# Patient Record
Sex: Female | Born: 1980 | Race: Black or African American | Hispanic: No | Marital: Single | Smoking: Former smoker
Health system: Southern US, Community
[De-identification: ages and names within clinical notes are randomized; demographics above are authoritative.]

## PROBLEM LIST (undated history)

## (undated) DIAGNOSIS — I509 Heart failure, unspecified: Secondary | ICD-10-CM

## (undated) DIAGNOSIS — F32A Depression, unspecified: Secondary | ICD-10-CM

## (undated) DIAGNOSIS — F319 Bipolar disorder, unspecified: Secondary | ICD-10-CM

## (undated) DIAGNOSIS — E785 Hyperlipidemia, unspecified: Secondary | ICD-10-CM

## (undated) DIAGNOSIS — G4733 Obstructive sleep apnea (adult) (pediatric): Secondary | ICD-10-CM

## (undated) DIAGNOSIS — F419 Anxiety disorder, unspecified: Secondary | ICD-10-CM

## (undated) HISTORY — DX: Bipolar disorder, unspecified: F31.9

## (undated) HISTORY — DX: Hyperlipidemia, unspecified: E78.5

## (undated) HISTORY — PX: SHOULDER SURGERY: SHX246

## (undated) HISTORY — DX: Heart failure, unspecified: I50.9

## (undated) HISTORY — DX: Depression, unspecified: F32.A

## (undated) HISTORY — DX: Anxiety disorder, unspecified: F41.9

## (undated) HISTORY — DX: Obstructive sleep apnea (adult) (pediatric): G47.33

---

## 2004-07-07 ENCOUNTER — Observation Stay: Payer: Self-pay | Admitting: Obstetrics & Gynecology

## 2004-08-24 ENCOUNTER — Observation Stay: Payer: Self-pay

## 2004-11-30 ENCOUNTER — Emergency Department: Payer: Self-pay | Admitting: Emergency Medicine

## 2006-01-16 ENCOUNTER — Emergency Department: Payer: Self-pay | Admitting: Emergency Medicine

## 2006-04-18 ENCOUNTER — Observation Stay: Payer: Self-pay | Admitting: Obstetrics and Gynecology

## 2006-06-21 ENCOUNTER — Observation Stay: Payer: Self-pay

## 2006-08-09 ENCOUNTER — Inpatient Hospital Stay: Payer: Self-pay | Admitting: Obstetrics and Gynecology

## 2008-02-18 ENCOUNTER — Emergency Department: Payer: Self-pay | Admitting: Emergency Medicine

## 2010-03-16 ENCOUNTER — Emergency Department: Payer: Self-pay | Admitting: Emergency Medicine

## 2011-03-24 ENCOUNTER — Emergency Department: Payer: Self-pay | Admitting: Unknown Physician Specialty

## 2011-04-23 ENCOUNTER — Encounter: Payer: Self-pay | Admitting: Physician Assistant

## 2011-06-05 ENCOUNTER — Ambulatory Visit: Payer: Self-pay | Admitting: Physician Assistant

## 2011-06-05 LAB — HCG, QUANTITATIVE, PREGNANCY: Beta Hcg, Quant.: <1 m[IU]/mL — ABNORMAL LOW

## 2011-06-26 ENCOUNTER — Ambulatory Visit: Payer: Self-pay | Admitting: Unknown Physician Specialty

## 2012-01-07 ENCOUNTER — Encounter: Payer: Self-pay | Admitting: Orthopedic Surgery

## 2012-01-08 ENCOUNTER — Encounter: Payer: Self-pay | Admitting: Orthopedic Surgery

## 2012-02-08 ENCOUNTER — Encounter: Payer: Self-pay | Admitting: Orthopedic Surgery

## 2013-01-03 ENCOUNTER — Emergency Department: Payer: Self-pay | Admitting: Emergency Medicine

## 2014-08-31 ENCOUNTER — Emergency Department
Admission: EM | Admit: 2014-08-31 | Discharge: 2014-08-31 | Disposition: A | Discharge status: Home / Self Care | Payer: Medicare Other | Attending: Emergency Medicine | Admitting: Emergency Medicine

## 2014-08-31 DIAGNOSIS — R0982 Postnasal drip: Secondary | ICD-10-CM | POA: Insufficient documentation

## 2014-08-31 DIAGNOSIS — R42 Dizziness and giddiness: Secondary | ICD-10-CM | POA: Diagnosis not present

## 2014-08-31 DIAGNOSIS — H6503 Acute serous otitis media, bilateral: Secondary | ICD-10-CM | POA: Diagnosis not present

## 2014-08-31 DIAGNOSIS — H9203 Otalgia, bilateral: Secondary | ICD-10-CM | POA: Diagnosis present

## 2014-08-31 DIAGNOSIS — Z720 Tobacco use: Secondary | ICD-10-CM | POA: Diagnosis not present

## 2014-08-31 DIAGNOSIS — J3489 Other specified disorders of nose and nasal sinuses: Secondary | ICD-10-CM | POA: Insufficient documentation

## 2014-08-31 MED ORDER — MECLIZINE HCL 12.5 MG PO TABS: 12.5000 mg | ORAL_TABLET | Freq: Three times a day (TID) | ORAL | Status: AC | PRN

## 2014-08-31 MED ORDER — OXYMETAZOLINE HCL 0.05 % NA SOLN: 1.0000 | Freq: Two times a day (BID) | NASAL | Status: AC

## 2014-08-31 MED ORDER — IBUPROFEN 800 MG PO TABS: 800.0000 mg | ORAL_TABLET | Freq: Three times a day (TID) | ORAL | Status: DC | PRN

## 2014-08-31 MED ORDER — LORATADINE-PSEUDOEPHEDRINE ER 5-120 MG PO TB12: 1.0000 | ORAL_TABLET | Freq: Two times a day (BID) | ORAL | Status: AC

## 2014-08-31 NOTE — ED Notes (Signed)
Patient c/o bilateral earache x1 week. States right worse than left.

## 2014-08-31 NOTE — ED Provider Notes (Signed)
CSN: [id number]     Arrival date & time 08/31/14  1230 History   First MD Initiated Contact with Patient 08/31/14 1259     Chief Complaint  Patient presents with  . Otalgia     (Consider location/radiation/quality/duration/timing/severity/associated sxs/prior Treatment) HPI The patient last 2 weeks complaining of fullness behind her right ear now into her left ear sinus congestion states that it's an achy feeling rates it as about a 4 out of 10 at the worst nothing seemingly making it better or worse states that when she moves her head too quickly certain when she does get a little dizzy one point she felt like she might pass out last week otherwise denies fevers chills nausea trauma or any other complaints of note History reviewed. No pertinent past medical history. Past Surgical History  Procedure Laterality Date  . Cesarean section    . Shoulder surgery Right    History reviewed. No pertinent family history. History  Substance Use Topics  . Smoking status: Current Every Day Smoker  . Smokeless tobacco: Not on file  . Alcohol Use: No   OB History    No data available     Review of Systems  Constitutional: Negative.   HENT: Positive for congestion, postnasal drip, rhinorrhea and sinus pressure.   Eyes: Negative.   Respiratory: Negative.   Cardiovascular: Negative.   Gastrointestinal: Negative.   Genitourinary: Negative.   Musculoskeletal: Negative.   Skin: Negative.   Allergic/Immunologic: Positive for environmental allergies.  Neurological: Positive for dizziness.  All other systems reviewed and are negative.      Allergies  Review of patient's allergies indicates no known allergies.  Home Medications   Prior to Admission medications   Medication Sig Start Date End Date Taking? Authorizing Provider  ibuprofen (ADVIL,MOTRIN) 800 MG tablet Take 1 tablet (800 mg total) by mouth every 8 (eight) hours as needed. 08/31/14   III William C Ruffian, PA-C   loratadine-pseudoephedrine (CLARITIN-D 12 HOUR) 5-120 MG per tablet Take 1 tablet by mouth 2 (two) times daily. 08/31/14 08/31/15  III William C Ruffian, PA-C  meclizine (ANTIVERT) 12.5 MG tablet Take 1 tablet (12.5 mg total) by mouth 3 (three) times daily as needed for dizziness or nausea. 08/31/14 08/31/15  III William C Ruffian, PA-C  oxymetazoline (AFRIN) 0.05 % nasal spray Place 1 spray into both nostrils 2 (two) times daily. 08/31/14 09/03/14  III William C Ruffian, PA-C   BP 130/87 mmHg  Pulse 64  Temp(Src) 98.4 F (36.9 C) (Oral)  Resp 15  Ht  (1.549 m)  Wt 210 lb (95.255 kg)  BMI 39.70 kg/m2  SpO2 100%  LMP 08/01/2014 (Approximate) Physical Exam  Female appearing stated age well-developed well-nourished and in no acute distress Vitals reviewed Head ears eyes nose neck and throat exam reveals pupils that are equal round reactive to light and accommodation extraocular motions are intact maxillary sinus tenderness fluid behind both eardrums postnasal drainage and rhinorrhea Cardiovascular regular rate and rhythm no murmurs rubs gallops Pulmonary lungs clear to auscultation bilaterally Skin is free of rash or disease Neuro exam is nonfocal cerebellar functions are intact cranial nerves II through XII grossly intact     Procedures   MDM  The patient has had sinus congestion and nasal drainage and pressure behind her ears without fever or other symptoms of note states that with quick movements she does get dizzy and does feel like there is fluid behind her ears exam confirms that she does appear to  have a serous otitis media start the patient on decongestants and have her follow-up with ear nose and throat return here for any acute concerns or worsening symptoms Final diagnoses:  Bilateral acute serous otitis media, recurrence not specified       III Rosalyn GessWilliam C Ruffian, PA-C 08/31/14 1408  Emily FilbertJonathan E Williams, MD 08/31/14 [phone]

## 2014-08-31 NOTE — ED Notes (Signed)
Pt c/o right earache X 2 weeks, dizziness, syncope X 1 last week. Pt alert and oriented X4, active, cooperative, pt in NAD. RR even and unlabored, color WNL.

## 2014-09-20 DIAGNOSIS — Z792 Long term (current) use of antibiotics: Secondary | ICD-10-CM | POA: Insufficient documentation

## 2014-09-20 DIAGNOSIS — Z720 Tobacco use: Secondary | ICD-10-CM | POA: Insufficient documentation

## 2014-09-20 DIAGNOSIS — J40 Bronchitis, not specified as acute or chronic: Secondary | ICD-10-CM | POA: Insufficient documentation

## 2014-09-21 ENCOUNTER — Emergency Department
Admission: EM | Admit: 2014-09-21 | Discharge: 2014-09-21 | Disposition: A | Discharge status: Home / Self Care | Payer: Medicare Other | Attending: Emergency Medicine | Admitting: Emergency Medicine

## 2014-09-21 ENCOUNTER — Emergency Department: Payer: Medicare Other

## 2014-09-21 ENCOUNTER — Encounter: Payer: Self-pay | Admitting: *Deleted

## 2014-09-21 DIAGNOSIS — J40 Bronchitis, not specified as acute or chronic: Secondary | ICD-10-CM

## 2014-09-21 MED ORDER — ALBUTEROL SULFATE (2.5 MG/3ML) 0.083% IN NEBU
INHALATION_SOLUTION | RESPIRATORY_TRACT | Status: AC
  Administered 2014-09-21: 2.5 mg via RESPIRATORY_TRACT
  Filled 2014-09-21: qty 3

## 2014-09-21 MED ORDER — KETOROLAC TROMETHAMINE 60 MG/2ML IM SOLN: 60.0000 mg | Freq: Once | INTRAMUSCULAR | Status: DC

## 2014-09-21 MED ORDER — ALBUTEROL SULFATE HFA 108 (90 BASE) MCG/ACT IN AERS: 2.0000 | INHALATION_SPRAY | Freq: Four times a day (QID) | RESPIRATORY_TRACT | Status: DC | PRN

## 2014-09-21 MED ORDER — BENZONATATE 100 MG PO CAPS: 100.0000 mg | ORAL_CAPSULE | Freq: Four times a day (QID) | ORAL | Status: AC | PRN

## 2014-09-21 MED ORDER — BENZONATATE 100 MG PO CAPS
100.0000 mg | ORAL_CAPSULE | Freq: Once | ORAL | Status: AC
  Administered 2014-09-21: 100 mg via ORAL

## 2014-09-21 MED ORDER — ALBUTEROL SULFATE (2.5 MG/3ML) 0.083% IN NEBU
2.5000 mg | INHALATION_SOLUTION | Freq: Once | RESPIRATORY_TRACT | Status: AC
  Administered 2014-09-21: 2.5 mg via RESPIRATORY_TRACT

## 2014-09-21 MED ORDER — BENZONATATE 100 MG PO CAPS
ORAL_CAPSULE | ORAL | Status: AC
  Administered 2014-09-21: 100 mg via ORAL
  Filled 2014-09-21: qty 1

## 2014-09-21 NOTE — ED Notes (Signed)
Pt states she has a cough for 1 day.  Pt spitting up clear phelgm.  cig smoker.  No fever

## 2014-09-21 NOTE — Discharge Instructions (Signed)
Upper Respiratory Infection, Adult An upper respiratory infection (URI) is also sometimes known as the common cold. The upper respiratory tract includes the nose, sinuses, throat, trachea, and bronchi. Bronchi are the airways leading to the lungs. Most people improve within 1 week, but symptoms can last up to 2 weeks. A residual cough may last even longer.  CAUSES Many different viruses can infect the tissues lining the upper respiratory tract. The tissues become irritated and inflamed and often become very moist. Mucus production is also common. A cold is contagious. You can easily spread the virus to others by oral contact. This includes kissing, sharing a glass, coughing, or sneezing. Touching your mouth or nose and then touching a surface, which is then touched by another person, can also spread the virus. SYMPTOMS  Symptoms typically develop 1 to 3 days after you come in contact with a cold virus. Symptoms vary from person to person. They may include:  Runny nose.  Sneezing.  Nasal congestion.  Sinus irritation.  Sore throat.  Loss of voice (laryngitis).  Cough.  Fatigue.  Muscle aches.  Loss of appetite.  Headache.  Low-grade fever. DIAGNOSIS  You might diagnose your own cold based on familiar symptoms, since most people get a cold 2 to 3 times a year. Your caregiver can confirm this based on your exam. Most importantly, your caregiver can check that your symptoms are not due to another disease such as strep throat, sinusitis, pneumonia, asthma, or epiglottitis. Blood tests, throat tests, and X-rays are not necessary to diagnose a common cold, but they may sometimes be helpful in excluding other more serious diseases. Your caregiver will decide if any further tests are required. RISKS AND COMPLICATIONS  You may be at risk for a more severe case of the common cold if you smoke cigarettes, have chronic heart disease (such as heart failure) or lung disease (such as asthma), or if  you have a weakened immune system. The very young and very old are also at risk for more serious infections. Bacterial sinusitis, middle ear infections, and bacterial pneumonia can complicate the common cold. The common cold can worsen asthma and chronic obstructive pulmonary disease (COPD). Sometimes, these complications can require emergency medical care and may be life-threatening. PREVENTION  The best way to protect against getting a cold is to practice good hygiene. Avoid oral or hand contact with people with cold symptoms. Wash your hands often if contact occurs. There is no clear evidence that vitamin C, vitamin E, echinacea, or exercise reduces the chance of developing a cold. However, it is always recommended to get plenty of rest and practice good nutrition. TREATMENT  Treatment is directed at relieving symptoms. There is no cure. Antibiotics are not effective, because the infection is caused by a virus, not by bacteria. Treatment may include:  Increased fluid intake. Sports drinks offer valuable electrolytes, sugars, and fluids.  Breathing heated mist or steam (vaporizer or shower).  Eating chicken soup or other clear broths, and maintaining good nutrition.  Getting plenty of rest.  Using gargles or lozenges for comfort.  Controlling fevers with ibuprofen or acetaminophen as directed by your caregiver.  Increasing usage of your inhaler if you have asthma. Zinc gel and zinc lozenges, taken in the first 24 hours of the common cold, can shorten the duration and lessen the severity of symptoms. Pain medicines may help with fever, muscle aches, and throat pain. A variety of non-prescription medicines are available to treat congestion and runny nose. Your caregiver   can make recommendations and may suggest nasal or lung inhalers for other symptoms.  HOME CARE INSTRUCTIONS   Only take over-the-counter or prescription medicines for pain, discomfort, or fever as directed by your  caregiver.  Use a warm mist humidifier or inhale steam from a shower to increase air moisture. This may keep secretions moist and make it easier to breathe.  Drink enough water and fluids to keep your urine clear or pale yellow.  Rest as needed.  Return to work when your temperature has returned to normal or as your caregiver advises. You may need to stay home longer to avoid infecting others. You can also use a face mask and careful hand washing to prevent spread of the virus. SEEK MEDICAL CARE IF:   After the first few days, you feel you are getting worse rather than better.  You need your caregiver's advice about medicines to control symptoms.  You develop chills, worsening shortness of breath, or brown or red sputum. These may be signs of pneumonia.  You develop yellow or brown nasal discharge or pain in the face, especially when you bend forward. These may be signs of sinusitis.  You develop a fever, swollen neck glands, pain with swallowing, or white areas in the back of your throat. These may be signs of strep throat. SEEK IMMEDIATE MEDICAL CARE IF:   You have a fever.  You develop severe or persistent headache, ear pain, sinus pain, or chest pain.  You develop wheezing, a prolonged cough, cough up blood, or have a change in your usual mucus (if you have chronic lung disease).  You develop sore muscles or a stiff neck. Document Released: 09/19/2000 Document Revised: 06/18/2011 Document Reviewed: 07/01/2013 Exit[employer] Patient Information 2015 [employer]. This information is not intended to replace advice given to you by your health care provider. Make sure you discuss any questions you have with your health care provider.  

## 2014-09-21 NOTE — ED Provider Notes (Signed)
[facility] Emergency Department Provider Note  ____________________________________________  Time seen: Approximately 133 AM  I have reviewed the triage vital signs and the nursing notes.   HISTORY  Chief Complaint Cough    HPI Sabrina Mata is a 34 y.o. female who comes in with a cough today. The patient reports that her chest feels tight and it hurts when she coughs. Reports that he use cough drops and Vicks for the cough but it did not help. The patient has not taken any Tylenol or ibuprofen. She denies any sick contacts at home. The patient reports that her cough is productive of yellow phlegm. She reports that she was here 2 weeks ago and told that she had fluid in her ear. The patient was concerned because of the chest discomfort so she came in for evaluation.She reports that her pain as a 9 out of 10 in intensity   History reviewed. No pertinent past medical history.  There are no active problems to display for this patient.   Past Surgical History  Procedure Laterality Date  . Cesarean section    . Shoulder surgery Right     Current Outpatient Rx  Name  Route  Sig  Dispense  Refill  . albuterol (PROVENTIL HFA;VENTOLIN HFA) 108 (90 BASE) MCG/ACT inhaler   Inhalation   Inhale 2 puffs into the lungs every 6 (six) hours as needed for wheezing or shortness of breath.   1 Inhaler   0   . benzonatate (TESSALON PERLES) 100 MG capsule   Oral   Take 1 capsule (100 mg total) by mouth every 6 (six) hours as needed for cough.   15 capsule   0   . ibuprofen (ADVIL,MOTRIN) 800 MG tablet   Oral   Take 1 tablet (800 mg total) by mouth every 8 (eight) hours as needed.   15 tablet   0   . loratadine-pseudoephedrine (CLARITIN-D 12 HOUR) 5-120 MG per tablet   Oral   Take 1 tablet by mouth 2 (two) times daily.   20 tablet   0   . meclizine (ANTIVERT) 12.5 MG tablet   Oral   Take 1 tablet (12.5 mg total) by mouth 3 (three) times daily as  needed for dizziness or nausea.   5 tablet   0     Allergies Review of patient's allergies indicates no known allergies.  No family history on file.  Social History History  Substance Use Topics  . Smoking status: Current Every Day Smoker  . Smokeless tobacco: Not on file  . Alcohol Use: No    Review of Systems Constitutional: No fever/chills Eyes: No visual changes. ENT: sore throat. Cardiovascular: chest pain with cough Respiratory: shortness of breath. Gastrointestinal: Post tussive emesis No abdominal pain.  No nausea,    Genitourinary: Negative for dysuria. Musculoskeletal: Negative for back pain. Skin: Negative for rash. Neurological: Negative for headaches,  10-point ROS otherwise negative.  ____________________________________________   PHYSICAL EXAM:  VITAL SIGNS: ED Triage Vitals  Enc Vitals Group     BP 09/21/14 0029 127/100 mmHg     Pulse Rate 09/21/14 0029 93     Resp 09/21/14 0029 20     Temp 09/21/14 0029 98.7 F (37.1 C)     Temp Source 09/21/14 0029 Oral     SpO2 09/21/14 0029 100 %     Weight 09/21/14 0029 210 lb (95.255 kg)     Height 09/21/14 0029 5\' 1"  (1.549 m)  Head Cir --      Peak Flow --      Pain Score 09/21/14 0032 9     Pain Loc --      Pain Edu? --      Excl. in GC? --     Constitutional: Alert and oriented. Well appearing and in no acute distress. Eyes: Conjunctivae are normal. PERRL. EOMI. Head: Atraumatic. Nose: No congestion/rhinnorhea. Mouth/Throat: Mucous membranes are moist.  Oropharynx non-erythematous. Hematological/Lymphatic/Immunilogical: No cervical lymphadenopathy. Cardiovascular: Normal rate, regular rhythm. Grossly normal heart sounds.  Good peripheral circulation. Respiratory: Normal respiratory effort.  No retractions. Lungs CTAB. Gastrointestinal: Soft and nontender. No distention. No abdominal bruits. No CVA tenderness. Genitourinary: deferred Musculoskeletal: No lower extremity tenderness nor  edema.   Neurologic:  Normal speech and language. No gross focal neurologic deficits are appreciated.  No gait instability. Skin:  Skin is warm, dry and intact. No rash noted. Psychiatric: Mood and affect are normal.   ____________________________________________   LABS (all labs ordered are listed, but only abnormal results are displayed)  Labs Reviewed - No data to display ____________________________________________  EKG  none ____________________________________________  RADIOLOGY  CXR: Minimal bibasilar atelectasis noted, lungs otherwise clear ____________________________________________   PROCEDURES  Procedure(s) performed: None  Critical Care performed: No  ____________________________________________   INITIAL IMPRESSION / ASSESSMENT AND PLAN / ED COURSE  Pertinent labs & imaging results that were available during my care of the patient were reviewed by me and considered in my medical decision making (see chart for details).  This is a 34 year old female who comes in with cough and chest discomfort with cough. She also reports some chest tightness. The patient did receive a breathing treatment and had Toradol ordered but the patient did not want the shot. I give the patient a dose of benzonatate as well. She reports that after the breathing treatment and cough medicine that her symptoms did improve. I will discharge the patient home and encourage her to take some high-dose ibuprofen as well as the ladder and benzonatate and have the patient follow-up with the acute care clinic. Patient agrees with the plan as stated. ____________________________________________   FINAL CLINICAL IMPRESSION(S) / ED DIAGNOSES  Final diagnoses:  Bronchitis      Rebecka Apley, MD 09/21/14 [phone]

## 2015-07-24 ENCOUNTER — Emergency Department
Admission: EM | Admit: 2015-07-24 | Discharge: 2015-07-24 | Disposition: A | Discharge status: Home / Self Care | Payer: Medicare Other | Attending: Emergency Medicine | Admitting: Emergency Medicine

## 2015-07-24 ENCOUNTER — Encounter: Payer: Self-pay | Admitting: Emergency Medicine

## 2015-07-24 DIAGNOSIS — M545 Low back pain, unspecified: Secondary | ICD-10-CM

## 2015-07-24 DIAGNOSIS — Z791 Long term (current) use of non-steroidal anti-inflammatories (NSAID): Secondary | ICD-10-CM | POA: Diagnosis not present

## 2015-07-24 DIAGNOSIS — F17210 Nicotine dependence, cigarettes, uncomplicated: Secondary | ICD-10-CM | POA: Diagnosis not present

## 2015-07-24 DIAGNOSIS — Z7951 Long term (current) use of inhaled steroids: Secondary | ICD-10-CM | POA: Diagnosis not present

## 2015-07-24 DIAGNOSIS — M5416 Radiculopathy, lumbar region: Secondary | ICD-10-CM | POA: Diagnosis not present

## 2015-07-24 MED ORDER — MELOXICAM 15 MG PO TABS: 15.0000 mg | ORAL_TABLET | Freq: Every day | ORAL | Status: DC

## 2015-07-24 MED ORDER — CYCLOBENZAPRINE HCL 5 MG PO TABS: 5.0000 mg | ORAL_TABLET | Freq: Three times a day (TID) | ORAL | Status: DC | PRN

## 2015-07-24 MED ORDER — MELOXICAM 7.5 MG PO TABS
15.0000 mg | ORAL_TABLET | Freq: Every day | ORAL | Status: DC
  Administered 2015-07-24: 15 mg via ORAL
  Filled 2015-07-24: qty 2

## 2015-07-24 NOTE — Discharge Instructions (Signed)
Back Pain, Adult °Back pain is very common in adults. The cause of back pain is rarely dangerous and the pain often gets better over time. The cause of your back pain may not be known. Some common causes of back pain include: °· Strain of the muscles or ligaments supporting the spine. °· Wear and tear (degeneration) of the spinal disks. °· Arthritis. °· Direct injury to the back. °For many people, back pain may return. Since back pain is rarely dangerous, most people can learn to manage this condition on their own. °HOME CARE INSTRUCTIONS °Watch your back pain for any changes. The following actions may help to lessen any discomfort you are feeling: °· Remain active. It is stressful on your back to sit or stand in one place for long periods of time. Do not sit, drive, or stand in one place for more than 30 minutes at a time. Take short walks on even surfaces as soon as you are able. Try to increase the length of time you walk each day. °· Exercise regularly as directed by your health care provider. Exercise helps your back heal faster. It also helps avoid future injury by keeping your muscles strong and flexible. °· Do not stay in bed. Resting more than 1-2 days can delay your recovery. °· Pay attention to your body when you bend and lift. The most comfortable positions are those that put less stress on your recovering back. Always use proper lifting techniques, including: °· Bending your knees. °· Keeping the load close to your body. °· Avoiding twisting. °· Find a comfortable position to sleep. Use a firm mattress and lie on your side with your knees slightly bent. If you lie on your back, put a pillow under your knees. °· Avoid feeling anxious or stressed. Stress increases muscle tension and can worsen back pain. It is important to recognize when you are anxious or stressed and learn ways to manage it, such as with exercise. °· Take medicines only as directed by your health care provider. Over-the-counter  medicines to reduce pain and inflammation are often the most helpful. Your health care provider may prescribe muscle relaxant drugs. These medicines help dull your pain so you can more quickly return to your normal activities and healthy exercise. °· Apply ice to the injured area: °· Put ice in a plastic bag. °· Place a towel between your skin and the bag. °· Leave the ice on for 20 minutes, 2-3 times a day for the first 2-3 days. After that, ice and heat may be alternated to reduce pain and spasms. °· Maintain a healthy weight. Excess weight puts extra stress on your back and makes it difficult to maintain good posture. °SEEK MEDICAL CARE IF: °· You have pain that is not relieved with rest or medicine. °· You have increasing pain going down into the legs or buttocks. °· You have pain that does not improve in one week. °· You have night pain. °· You lose weight. °· You have a fever or chills. °SEEK IMMEDIATE MEDICAL CARE IF:  °· You develop new bowel or bladder control problems. °· You have unusual weakness or numbness in your arms or legs. °· You develop nausea or vomiting. °· You develop abdominal pain. °· You feel faint. °  °This information is not intended to replace advice given to you by your health care provider. Make sure you discuss any questions you have with your health care provider. °  °Document Released: 03/26/2005 Document Revised: 04/16/2014 Document Reviewed: 07/28/2013 °Elsevier Interactive Patient Education ©2016 [employer]  Lumbosacral Radiculopathy Lumbosacral radiculopathy is a condition that involves the spinal nerves and nerve roots in the low back and bottom of the spine. The condition develops when these nerves and nerve roots move out of place or become inflamed and cause symptoms. CAUSES This condition may be caused by:  Pressure from a disk that bulges out of place (herniated disk). A disk is a plate of cartilage that separates bones in the spine.  Disk degeneration.  A  narrowing of the bones of the lower back (spinal stenosis).  A tumor.  An infection.  An injury that places sudden pressure on the disks that cushion the bones of your lower spine. RISK FACTORS This condition is more likely to develop in:  Males aged 30-50 years.  Females aged 28-60 years.  People who lift improperly.  People who are overweight or live a sedentary lifestyle.  People who smoke.  People who perform repetitive activities that strain the spine. SYMPTOMS Symptoms of this condition include:  Pain that goes down from the back into the legs (sciatica). This is the most common symptom. The pain may be worse with sitting, coughing, or sneezing.  Pain and numbness in the arms and legs.  Muscle weakness.  Tingling.  Loss of bladder control or bowel control. DIAGNOSIS This condition is diagnosed with a physical exam and medical history. If the pain is lasting, you may have tests, such as:  MRI scan.  X-ray.  CT scan.  Myelogram.  Nerve conduction study. TREATMENT This condition is often treated with:  Hot packs and ice applied to affected areas.  Stretches to improve flexibility.  Exercises to strengthen back muscles.  Physical therapy.  Pain medicine.  A steroid injection in the spine. In some cases, no treatment is needed. If the condition is long-lasting (chronic), or if symptoms are severe, treatment may involve surgery or lifestyle changes, such as following a weight loss plan. HOME CARE INSTRUCTIONS Medicines  Take medicines only as directed by your health care provider.  Do not drive or operate heavy machinery while taking pain medicine. Injury Care  Apply a heat pack to the injured area as directed by your health care provider.  Apply ice to the affected area:  Put ice in a plastic bag.  Place a towel between your skin and the bag.  Leave the ice on for 20-30 minutes, every 2 hours while you are awake or as needed. Or, leave the  ice on for as long as directed by your health care provider. Other Instructions  If you were shown how to do any exercises or stretches, do them as directed by your health care provider.  If your health care provider prescribed a diet or exercise program, follow it as directed.  Keep all follow-up visits as directed by your health care provider. This is important. SEEK MEDICAL CARE IF:  Your pain does not improve over time even when taking pain medicines. SEEK IMMEDIATE MEDICAL CARE IF:  Your develop severe pain.  Your pain suddenly gets worse.  You develop increasing weakness in your legs.  You lose the ability to control your bladder or bowel.  You have difficulty walking or balancing.  You have a fever.   This information is not intended to replace advice given to you by your health care provider. Make sure you discuss any questions you have with your health care provider.   Document Released: 03/26/2005 Document Revised: 08/10/2014 Document Reviewed: 03/22/2014 Elsevier Interactive Patient Education 2016 [employer]  [employer]  Your exam shows some likely strain to the lower back with some mild nerve irritation. Take the prescription meds as directed. [facility] and Dr. Ernest PineHooten as discussed. Apply ice or moist heat to the back for comfort.

## 2015-07-24 NOTE — ED Provider Notes (Signed)
[facility] Emergency Department Provider Note ____________________________________________  Time seen: 1737  I have reviewed the triage vital signs and the nursing notes.  HISTORY  Chief Complaint  Sciatica  HPI Sabrina Mata is a 35 y.o. female presents herself to the ED for 2 days complaint of low back pain with some referral to the left side. She is a history of infrequent back pain, and notes a previous visit here probably about 2 years ago. She describes she was treated with steroids and had resolution of her symptoms. She denies any lotion any weakness, foot drop, or incontinence. She denies any recent injury, trauma, or accident. She describes the pain in the midline of the lower back is a pinched, and the pain to lateral thigh as sharp in nature. She is only dosed over-the-counter back pain by mouth without significant benefit.She describes her pain as a 10/10 in triage.  History reviewed. No pertinent past medical history.  There are no active problems to display for this patient.   Past Surgical History  Procedure Laterality Date  . Cesarean section    . Shoulder surgery Right     Current Outpatient Rx  Name  Route  Sig  Dispense  Refill  . albuterol (PROVENTIL HFA;VENTOLIN HFA) 108 (90 BASE) MCG/ACT inhaler   Inhalation   Inhale 2 puffs into the lungs every 6 (six) hours as needed for wheezing or shortness of breath.   1 Inhaler   0   . benzonatate (TESSALON PERLES) 100 MG capsule   Oral   Take 1 capsule (100 mg total) by mouth every 6 (six) hours as needed for cough.   15 capsule   0   . cyclobenzaprine (FLEXERIL) 5 MG tablet   Oral   Take 1 tablet (5 mg total) by mouth 3 (three) times daily as needed for muscle spasms.   15 tablet   0   . ibuprofen (ADVIL,MOTRIN) 800 MG tablet   Oral   Take 1 tablet (800 mg total) by mouth every 8 (eight) hours as needed.   15 tablet   0   . loratadine-pseudoephedrine (CLARITIN-D 12  HOUR) 5-120 MG per tablet   Oral   Take 1 tablet by mouth 2 (two) times daily.   20 tablet   0   . meclizine (ANTIVERT) 12.5 MG tablet   Oral   Take 1 tablet (12.5 mg total) by mouth 3 (three) times daily as needed for dizziness or nausea.   5 tablet   0   . meloxicam (MOBIC) 15 MG tablet   Oral   Take 1 tablet (15 mg total) by mouth daily.   30 tablet   0    Allergies Review of patient's allergies indicates no known allergies.  History reviewed. No pertinent family history.  Social History Social History  Substance Use Topics  . Smoking status: Current Some Day Smoker    Types: Cigarettes  . Smokeless tobacco: None  . Alcohol Use: No   Review of Systems  Constitutional: Negative for fever. Cardiovascular: Negative for chest pain. Respiratory: Negative for shortness of breath. Gastrointestinal: Negative for abdominal pain, vomiting and diarrhea. Genitourinary: Negative for dysuria. Musculoskeletal: Positive for back pain. Skin: Negative for rash. Neurological: Negative for headaches, focal weakness or numbness. ____________________________________________  PHYSICAL EXAM:  VITAL SIGNS: ED Triage Vitals  Enc Vitals Group     BP 07/24/15 1649 129/84 mmHg     Pulse Rate 07/24/15 1649 103     Resp  07/24/15 1649 18     Temp 07/24/15 1649 98.4 F (36.9 C)     Temp Source 07/24/15 1649 Oral     SpO2 07/24/15 1649 98 %     Weight 07/24/15 1649 210 lb (95.255 kg)     Height 07/24/15 1649  (1.549 m)     Head Cir --      Peak Flow --      Pain Score 07/24/15 1650 10     Pain Loc --      Pain Edu? --      Excl. in GC? --    Constitutional: Alert and oriented. Well appearing and in no distress. Head: Normocephalic and atraumatic. Hematological/Lymphatic/Immunological: No lymphedema Cardiovascular: Normal rate, regular rhythm. Normal distal pulses. Respiratory: Normal respiratory effort. No wheezes/rales/rhonchi. Gastrointestinal: Soft and nontender. No  distention. Musculoskeletal: Normal spinal on it without midline tenderness, spasm, deformity, or step-off. Pacing transition from sit to stand without difficulty. She notes a negative seated straight leg raise on exam. Normal full range of motion of the bilateral hips without deficit. Nontender with normal range of motion in all extremities.  Neurologic: Cranial nerves II through XII grossly intact. Normal LE DTRs bilaterally. Normal toe dorsiflexion and foot eversion on exam. Normal gait without ataxia. Normal speech and language. No gross focal neurologic deficits are appreciated. Skin:  Skin is warm, dry and intact. No rash noted. Psychiatric: Mood and affect are normal. Patient exhibits appropriate insight and judgment. ____________________________________________  PROCEDURES  meloxicam 15 mg PO ____________________________________________  INITIAL IMPRESSION / ASSESSMENT AND PLAN / ED COURSE  Patient with lumbar sacral strain with some left radicular symptoms. Her exam otherwise is reassuring for no significant intramuscular deficit. She'll be discharged with a prescription for meloxicam and Flexeril to dose as directed. She'll be referred to [facility] for routine medical management. She is also provided with the name for orthopedics for ongoing symptom management. ____________________________________________  FINAL CLINICAL IMPRESSION(S) / ED DIAGNOSES  Final diagnoses:  Left-sided low back pain without sciatica  Left lumbar radiculopathy      Lissa Hoard, PA-C 07/24/15 1808  Arnaldo Natal, MD 07/25/15 [phone]

## 2015-07-24 NOTE — ED Notes (Signed)
Back pain that goes down leg x 3 days

## 2015-07-24 NOTE — ED Notes (Signed)
See triage note..the patient sts she has been seen here before for leg pain.  Denies injury.  Pt sts she has been taking "[employer] back and body pain" medication.

## 2019-10-01 ENCOUNTER — Encounter: Payer: Self-pay | Admitting: Advanced Practice Midwife

## 2019-10-01 ENCOUNTER — Other Ambulatory Visit: Payer: Self-pay

## 2019-10-01 ENCOUNTER — Ambulatory Visit: Payer: Self-pay | Admitting: Advanced Practice Midwife

## 2019-10-01 VITALS — BP 129/85 | Ht 61.0 in | Wt 223.4 lb

## 2019-10-01 DIAGNOSIS — T7422XA Child sexual abuse, confirmed, initial encounter: Secondary | ICD-10-CM | POA: Insufficient documentation

## 2019-10-01 DIAGNOSIS — F319 Bipolar disorder, unspecified: Secondary | ICD-10-CM

## 2019-10-01 DIAGNOSIS — F4310 Post-traumatic stress disorder, unspecified: Secondary | ICD-10-CM

## 2019-10-01 DIAGNOSIS — Z113 Encounter for screening for infections with a predominantly sexual mode of transmission: Secondary | ICD-10-CM

## 2019-10-01 DIAGNOSIS — F419 Anxiety disorder, unspecified: Secondary | ICD-10-CM

## 2019-10-01 DIAGNOSIS — F4000 Agoraphobia, unspecified: Secondary | ICD-10-CM

## 2019-10-01 DIAGNOSIS — F17200 Nicotine dependence, unspecified, uncomplicated: Secondary | ICD-10-CM

## 2019-10-01 DIAGNOSIS — T7422XS Child sexual abuse, confirmed, sequela: Secondary | ICD-10-CM

## 2019-10-01 DIAGNOSIS — Z3201 Encounter for pregnancy test, result positive: Secondary | ICD-10-CM

## 2019-10-01 LAB — WET PREP FOR TRICH, YEAST, CLUE
Trichomonas Exam: NEGATIVE
Yeast Exam: NEGATIVE

## 2019-10-01 LAB — PREGNANCY, URINE: Preg Test, Ur: POSITIVE — AB

## 2019-10-01 MED ORDER — PRENATAL VITAMIN 27-0.8 MG PO TABS: 1.0000 | ORAL_TABLET | Freq: Every day | ORAL | 0 refills | Status: DC

## 2019-10-01 NOTE — Progress Notes (Signed)
PT positive today. Patient given pregnancy documentation and AMariana Kaufman card. Patient states she plans to start maternity care at [facility] at about 11-12 weeks. Patient wet mount reviewed with provider, no treatment needed per provider. Patient counseled to talk with partner about what he was treated for and then to call for appointment for treatment as contact. PNV given today.Burt Knack, RN

## 2019-10-01 NOTE — Progress Notes (Signed)
[place]

## 2019-10-01 NOTE — Progress Notes (Signed)
[facility] Department STI clinic/screening visit  Subjective:  Sabrina Mata is a 39 y.o. G4P2 smoker female being seen today for an STI screening visit because took her partner to ER over the weekend for scrotal edema; no dx but he was given a shot and po antibiotics. The patient reports they do not have symptoms.  Patient reports that they do not desire a pregnancy in the next year.   They reported they are not interested in discussing contraception today.  Patient's last menstrual period was 08/09/2019 (exact date).   Patient has the following medical conditions:   Patient Active Problem List   Diagnosis Date Noted  . Morbid obesity (H[facility]) 210 lbs 10/01/2019    No chief complaint on file.   HPI  Patient reports LMP 09/02/19.  Last sex 09/29/19 without condom.  Smoking 4-1 ppd.  Last MJ 08/16/19.  Last ETOH last night (2 mixed drinks) 2x/wk. PTSD dx'd 2018.  Bipolar and anxiety dx'd 2004.   Last HIV test per patient/review of record was unsure Patient reports last pap was unsure maybe 2008  See flowsheet for further details and programmatic requirements.    The following portions of the patient's history were reviewed and updated as appropriate: allergies, current medications, past medical history, past social history, past surgical history and problem list.  Objective:  There were no vitals filed for this visit.  Physical Exam Vitals and nursing note reviewed.  Constitutional:      Appearance: Normal appearance. She is obese.  HENT:     Head: Normocephalic and atraumatic.     Mouth/Throat:     Mouth: Mucous membranes are moist.     Pharynx: Oropharynx is clear. No oropharyngeal exudate or posterior oropharyngeal erythema.  Pulmonary:     Effort: Pulmonary effort is normal.  Abdominal:     General: Abdomen is flat.     Palpations: There is no mass.     Tenderness: There is no abdominal tenderness. There is no rebound.     Comments: Poor tone, soft  without tenderness, increased adipose  Genitourinary:    General: Normal vulva.     Exam position: Lithotomy position.     Pubic Area: No rash or pubic lice.      Labia:        Right: No rash or lesion.        Left: No rash or lesion.      Vagina: Vaginal discharge (grey luekorrhea, ph>4.5) present. No erythema, bleeding or lesions.     Cervix: Normal.     Uterus: Normal.      Rectum: Normal.     Comments: Difficult to assess due to increased adipose and pt's anxiety Lymphadenopathy:     Head:     Right side of head: No preauricular or posterior auricular adenopathy.     Left side of head: No preauricular or posterior auricular adenopathy.     Cervical: No cervical adenopathy.     Upper Body:     Right upper body: No supraclavicular or axillary adenopathy.     Left upper body: No supraclavicular or axillary adenopathy.     Lower Body: No right inguinal adenopathy. No left inguinal adenopathy.  Skin:    General: Skin is warm and dry.     Findings: No rash.  Neurological:     Mental Status: She is alert and oriented to person, place, and time.      Assessment and Plan:  Sabrina Mata  is a 39 y.o. female presenting to the [facility] Department for STI screening  1. Morbid obesity ([place]) 210 lbs   2. Screening examination for venereal disease Treat wet mount per standing orders Immunization nurse consult Please give [place] contact info to pt Please give condoms - WET PREP FOR [place], YEAST, CLUE - Pregnancy, urine - Syphilis Serology, [place] [facility] - HIV/HCV [place] [facility] - HBV Antigen/Antibody [place] [facility] - Chlamydia/Gonorrhea [place] [facility] - Gonococcus culture     No follow-ups on file.  No future appointments.  Herbie Saxon, CNM

## 2019-10-05 ENCOUNTER — Emergency Department: Payer: Medicare Other

## 2019-10-05 ENCOUNTER — Emergency Department
Admission: EM | Admit: 2019-10-05 | Discharge: 2019-10-05 | Disposition: A | Discharge status: Home / Self Care | Payer: Medicare Other | Attending: Emergency Medicine | Admitting: Emergency Medicine

## 2019-10-05 ENCOUNTER — Other Ambulatory Visit: Payer: Self-pay

## 2019-10-05 ENCOUNTER — Encounter: Payer: Self-pay | Admitting: Emergency Medicine

## 2019-10-05 DIAGNOSIS — Z3A01 Less than 8 weeks gestation of pregnancy: Secondary | ICD-10-CM | POA: Diagnosis not present

## 2019-10-05 DIAGNOSIS — O99331 Smoking (tobacco) complicating pregnancy, first trimester: Secondary | ICD-10-CM | POA: Insufficient documentation

## 2019-10-05 DIAGNOSIS — N939 Abnormal uterine and vaginal bleeding, unspecified: Secondary | ICD-10-CM | POA: Diagnosis present

## 2019-10-05 DIAGNOSIS — O209 Hemorrhage in early pregnancy, unspecified: Secondary | ICD-10-CM | POA: Diagnosis not present

## 2019-10-05 DIAGNOSIS — O09521 Supervision of elderly multigravida, first trimester: Secondary | ICD-10-CM | POA: Diagnosis not present

## 2019-10-05 DIAGNOSIS — R319 Hematuria, unspecified: Secondary | ICD-10-CM

## 2019-10-05 DIAGNOSIS — F17210 Nicotine dependence, cigarettes, uncomplicated: Secondary | ICD-10-CM | POA: Diagnosis not present

## 2019-10-05 DIAGNOSIS — O200 Threatened abortion: Secondary | ICD-10-CM

## 2019-10-05 LAB — URINALYSIS, COMPLETE (UACMP) WITH MICROSCOPIC
Bilirubin Urine: NEGATIVE
Glucose, UA: NEGATIVE mg/dL
Ketones, ur: NEGATIVE mg/dL
Nitrite: NEGATIVE
Protein, ur: 30 mg/dL — AB
RBC / HPF: >50 RBC/hpf — ABNORMAL HIGH (ref 0–5)
Specific Gravity, Urine: 1.025 (ref 1.005–1.030)
pH: 7 (ref 5.0–8.0)

## 2019-10-05 LAB — POCT PREGNANCY, URINE: Preg Test, Ur: POSITIVE — AB

## 2019-10-05 LAB — COMPREHENSIVE METABOLIC PANEL
ALT: 16 U/L (ref 0–44)
AST: 15 U/L (ref 15–41)
Albumin: 3.5 g/dL (ref 3.5–5.0)
Alkaline Phosphatase: 52 U/L (ref 38–126)
Anion gap: 7 (ref 5–15)
BUN: 16 mg/dL (ref 6–20)
CO2: 26 mmol/L (ref 22–32)
Calcium: 8.5 mg/dL — ABNORMAL LOW (ref 8.9–10.3)
Chloride: 105 mmol/L (ref 98–111)
Creatinine, Ser: 0.9 mg/dL (ref 0.44–1.00)
GFR calc Af Amer: >60 mL/min (ref >60)
GFR calc non Af Amer: >60 mL/min (ref >60)
Glucose, Bld: 101 mg/dL — ABNORMAL HIGH (ref 70–99)
Potassium: 3.7 mmol/L (ref 3.5–5.1)
Sodium: 138 mmol/L (ref 135–145)
Total Bilirubin: 0.6 mg/dL (ref 0.3–1.2)
Total Protein: 7.3 g/dL (ref 6.5–8.1)

## 2019-10-05 LAB — CBC
HCT: 38 % (ref 36.0–46.0)
Hemoglobin: 13.4 g/dL (ref 12.0–15.0)
MCH: 31 pg (ref 26.0–34.0)
MCHC: 35.3 g/dL (ref 30.0–36.0)
MCV: 88 fL (ref 80.0–100.0)
Platelets: 336 10*3/uL (ref 150–400)
RBC: 4.32 MIL/uL (ref 3.87–5.11)
RDW: 13.8 % (ref 11.5–15.5)
WBC: 11.4 10*3/uL — ABNORMAL HIGH (ref 4.0–10.5)
nRBC: 0 % (ref 0.0–0.2)

## 2019-10-05 LAB — HCG, QUANTITATIVE, PREGNANCY: hCG, Beta Chain, Quant, S: 2679 m[IU]/mL — ABNORMAL HIGH (ref <5)

## 2019-10-05 LAB — ABO/RH: ABO/RH(D): A POS

## 2019-10-05 MED ORDER — CEPHALEXIN 500 MG PO CAPS: 500.0000 mg | ORAL_CAPSULE | Freq: Three times a day (TID) | ORAL | 0 refills | Status: AC

## 2019-10-05 NOTE — ED Notes (Signed)
Pt reports being at home and wiping and "a little blood" on the toilet paper, LMP 5 May 21, pt reports 3 and 39 year old children at home; pt reports small amount of some cramping but otherwise no pain, denies dysuria or frequency

## 2019-10-05 NOTE — ED Triage Notes (Signed)
Patient states that she is [redacted] weeks pregnant. Patient states that when she went to the bathroom about 01:00 she had a small amount of vaginal bleeding. Patient states that she has had some slight intermittent abdominal cramping.

## 2019-10-05 NOTE — Discharge Instructions (Addendum)
Your pregnancy is very early.  We are seeing the gestational sac on ultrasound but not the baby.  This could be because the pregnancy is so early.  I need you to follow-up with Dr. Dalbert Garnet the OB/GYN doctor in the office in 2 days she will check on you and check your pregnancy hormone level make sure it is going up like it should be.  She will also do another ultrasound in about 11 days.  This will help [place] tell if pregnancy will continue or not and if there is a baby and then or not.  Please be sure to keep the appointment.  Please return here for heavier bleeding worse pain or any other problems.  He also seemed to have a very mild UTI.  In pregnancy this can get bad fast so we will give you some antibiotics.  I will give you some Keflex 1 pill 3 times a day to take care of it.
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
## 2019-10-06 LAB — HEPATITIS B SURFACE ANTIGEN: Hepatitis B Surface Ag: NONREACTIVE

## 2019-10-06 LAB — GONOCOCCUS CULTURE

## 2019-10-07 ENCOUNTER — Encounter: Payer: Self-pay | Admitting: Family Medicine

## 2019-10-08 ENCOUNTER — Encounter: Payer: Self-pay | Admitting: Family Medicine

## 2019-10-08 LAB — HM HEPATITIS C SCREENING LAB: HM Hepatitis Screen: NEGATIVE

## 2019-10-08 LAB — HM HIV SCREENING LAB: HM HIV Screening: NEGATIVE

## 2019-10-27 ENCOUNTER — Encounter: Payer: Self-pay | Admitting: Licensed Clinical Social Worker

## 2019-10-27 ENCOUNTER — Ambulatory Visit: Payer: Medicare Other | Admitting: Licensed Clinical Social Worker

## 2019-10-27 DIAGNOSIS — F3181 Bipolar II disorder: Secondary | ICD-10-CM

## 2019-10-27 DIAGNOSIS — F4310 Post-traumatic stress disorder, unspecified: Secondary | ICD-10-CM

## 2019-10-27 DIAGNOSIS — F4000 Agoraphobia, unspecified: Secondary | ICD-10-CM

## 2019-10-27 DIAGNOSIS — F411 Generalized anxiety disorder: Secondary | ICD-10-CM
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
## 2019-11-09 ENCOUNTER — Ambulatory Visit: Payer: Medicare Other | Admitting: Licensed Clinical Social Worker

## 2019-11-09 DIAGNOSIS — F411 Generalized anxiety disorder: Secondary | ICD-10-CM

## 2019-11-09 DIAGNOSIS — F3181 Bipolar II disorder: Secondary | ICD-10-CM

## 2019-11-09 DIAGNOSIS — F4310 Post-traumatic stress disorder, unspecified: Secondary | ICD-10-CM

## 2019-11-09 DIAGNOSIS — F4000 Agoraphobia, unspecified: Secondary | ICD-10-CM

## 2019-11-09 NOTE — Progress Notes (Signed)
Counselor/Therapist Progress Note  Patient ID: Sabrina Mata, MRN: [id number],    Date: 11/09/2019  Time Spent: 45 minutes    Treatment Type: Individual Therapy  Reported Symptoms: Obsessive thinking, Anhedonia, Sleep disturbance, Isolation and withdrawal, Fatigue and depressed mood   Mental Status Exam:  Appearance:   NA     Behavior:  Appropriate and Sharing  Motor:  NA  Speech/Language:   Normal Rate  Affect:  NA  Mood:  normal  Thought process:  normal  Thought content:    WNL  Sensory/Perceptual disturbances:    WNL  Orientation:  oriented to person, place, time/date, situation and day of week  Attention:  Good  Concentration:  Good  Memory:  WNL  Fund of knowledge:   Good  Insight:    Good  Judgment:   Good  Impulse Control:  Good   Risk Assessment: Danger to Self:  No Self-injurious Behavior: No Danger to Others: No Duty to Warn:no Physical Aggression / Violence:No  Access to Firearms a concern: No  Gang Involvement:No   Subjective: Patient was engaged and cooperative throughout the session using time effectively to discuss thoughts, feelings and treatment. Patient voices continued motivation for treatment and understanding of mood and anxiety issues related to loss. Patient is likely to benefit from future treatment because she remains motivated to decrease depression and anxiety and reports benefit of regular sessions in addressing these symptoms.   Interventions: Cognitive Behavioral Therapy and Grief Therapy  Established psychological safety. Checked in with patient regarding her week. Checked in with patient and reviewed previous session, including assessment and goal of treatment. Reviewed CBTs. Explored patient's goal of treatment, including current psychosocial stressors and worked collaboratively to develop CBT treatment plan. Provided support through active listening, validation of feelings, and highlighted patient's strengths. Provided support  through active listening, validation of feelings, and highlighted patient's strengths.   Diagnosis:   ICD-10-CM   1. Bipolar II disorder, most recent episode major depressive (HCC)  F31.81   2. Agoraphobia  F40.00   3. PTSD (post-traumatic stress disorder)  F43.10   4. GAD (generalized anxiety disorder)  F41.1    Plan: Patient's goal is: more positive outlook on life.  Treatment Target: Understand the relationship between thoughts, emotions, and behaviors  - Psychoeducation on CBT model   - Teach the connection between thoughts, emotions, and behaviors   Treatment Target: Increase realistic balanced thinking  - Explore patient's thoughts, beliefs, automatic thoughts, assumptions  - Identify unhelpful thinking patterns  - Identify and replace thinking that leads to depression and anxiety  - Process distress and allow for emotional release  - Cognitive reframing  - Questioning and challenging thoughts  Treatment Target: Increase activities - Behavioral activation      - Provide psychoeducation on Behavior Activation  - Values clarification  - Identify activities/goals aligning with values - Discuss barriers and problem solve   Treatment Target: Increase acceptance of loss  Objectives/treatment focus: Marland Kitchen Develop vocabulary to describe feelings of grief and loss . Identify grief and loss issues  . Identify steps toward managing grief . Gain awareness, and accept that grief and loss is causing problems . Deal with uncomfortable feelings resulting from grief or loss . Deal with feelings of guilt about his grief, "should haves," "if onlys" . Practice Mindfulness/acceptance meditation for anxiety/worry/ruminating thoughts  Future Appointments  Date Time Provider Department Center  11/23/2019  9:30 AM Kathreen Cosier, LCSW [facility] None    Interpreter used: NA  Kathreen Cosier,  LCSW

## 2019-11-23 ENCOUNTER — Ambulatory Visit: Payer: Medicare Other | Admitting: Licensed Clinical Social Worker

## 2019-11-23 NOTE — Progress Notes (Unsigned)
Counselor/Therapist Progress Note  Patient ID: Santasia Rew, MRN: [id number],    Date: 11/23/2019  Time Spent: ***   Treatment Type: Individual Therapy  Reported Symptoms: {CHL AMB Reported Symptoms:[id number]}  Mental Status Exam:  Appearance:   {PSY:22683}     Behavior:  {PSY:21022743}  Motor:  {PSY:22302}  Speech/Language:   {PSY:22685}  Affect:  {PSY:22687}  Mood:  {PSY:31886}  Thought process:  {PSY:31888}  Thought content:    {PSY:[id number]}  Sensory/Perceptual disturbances:    {PSY:248-253-9153}  Orientation:  {PSY:30297}  Attention:  {PSY:22877}  Concentration:  {PSY:603-207-4876}  Memory:  {PSY:786-108-2124}  Fund of knowledge:   {PSY:603-207-4876}  Insight:    {PSY:603-207-4876}  Judgment:   {PSY:603-207-4876}  Impulse Control:  {PSY:603-207-4876}   Risk Assessment: Danger to Self:  No Self-injurious Behavior: No Danger to Others: No Duty to Warn:no Physical Aggression / Violence:No  Access to Firearms a concern: No  Gang Involvement:No   Subjective: Patient was engaged and cooperative throughout the session using time effectively to discuss   Patient voices continued motivation for treatment and understanding of  . Patient is likely to benefit from future treatment because  remains motivated to decrease  And   and reports benefit of regular sessions in addressing these symptoms.   Interventions: Cognitive Behavioral Therapy Established psychological safety.  Diagnosis:   ICD-10-CM   1. Bipolar I disorder (HCC)  F31.9   2. Generalized anxiety disorder  F41.1   3. PTSD (post-traumatic stress disorder) dx'd 2018  F43.10     Plan: Patient's goal NL:GXQJ positive outlook on life. Treatment Target: Understand the relationship between thoughts, emotions, and behaviors   Psychoeducation on CBT model    Teach the connection between thoughts, emotions, and behaviors   Treatment Target: Increase realistic balanced thinking   Explore patient's thoughts,  beliefs, automatic thoughts, assumptions   Identify unhelpful thinking patterns   Identify and replace thinking that leads to depression and anxiety   Process distress and allow for emotional release   Cognitive reframing   Questioning and challenging thoughts  Treatment Target: Increase activities - Behavioral activation       Provide psychoeducation on Behavior Activation   Values clarification   Identify activities/goals aligning with values  Discuss barriers and problem solve   Treatment Target: Increase acceptance of loss  Objectives/treatment focus:  Develop vocabulary to describe feelings of grief and loss  Identify grief and loss issues   Identify steps toward managing grief  Gain awareness, and accept that grief and loss is causing problems  Deal with uncomfortable feelings resulting from grief or loss  Deal with feelings of guilt about his grief, "should haves," "if onlys"  Practice Mindfulness/acceptance meditation for anxiety/worry/ruminating thoughts  Future Appointments  Date Time Provider Department Center  11/23/2019  9:30 AM Kathreen Cosier, LCSW [facility] None    Interpreter used: NA   Kathreen Cosier, LCSW

## 2020-11-27 ENCOUNTER — Emergency Department
Admission: EM | Admit: 2020-11-27 | Discharge: 2020-11-27 | Disposition: A | Discharge status: Home / Self Care | Payer: Medicare Other | Attending: Emergency Medicine | Admitting: Emergency Medicine

## 2020-11-27 ENCOUNTER — Encounter: Payer: Self-pay | Admitting: Emergency Medicine

## 2020-11-27 ENCOUNTER — Other Ambulatory Visit: Payer: Self-pay

## 2020-11-27 DIAGNOSIS — M5442 Lumbago with sciatica, left side: Secondary | ICD-10-CM | POA: Diagnosis not present

## 2020-11-27 DIAGNOSIS — F17210 Nicotine dependence, cigarettes, uncomplicated: Secondary | ICD-10-CM | POA: Diagnosis not present

## 2020-11-27 DIAGNOSIS — M545 Low back pain, unspecified: Secondary | ICD-10-CM | POA: Diagnosis present

## 2020-11-27 LAB — URINALYSIS, COMPLETE (UACMP) WITH MICROSCOPIC
Bilirubin Urine: NEGATIVE
Glucose, UA: NEGATIVE mg/dL
Ketones, ur: NEGATIVE mg/dL
Leukocytes,Ua: NEGATIVE
Nitrite: NEGATIVE
Protein, ur: NEGATIVE mg/dL
RBC / HPF: >50 RBC/hpf — ABNORMAL HIGH (ref 0–5)
Specific Gravity, Urine: >1.03 — ABNORMAL HIGH (ref 1.005–1.030)
pH: 5.5 (ref 5.0–8.0)

## 2020-11-27 LAB — POC URINE PREG, ED: Preg Test, Ur: NEGATIVE

## 2020-11-27 MED ORDER — BACLOFEN 10 MG PO TABS: 10.0000 mg | ORAL_TABLET | Freq: Three times a day (TID) | ORAL | 0 refills | Status: AC

## 2020-11-27 MED ORDER — MELOXICAM 15 MG PO TABS: 15.0000 mg | ORAL_TABLET | Freq: Every day | ORAL | 2 refills | Status: AC

## 2020-11-27 NOTE — ED Triage Notes (Signed)
Pt arrived via POV with reports of low back pain x 4 days causing a disruption in sleep. Pt states she was here several years ago for back pain.  Pt states she was using biofreeze and bayer back and body without relief.
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
## 2020-11-27 NOTE — ED Notes (Signed)
See triage note  Presents with lower back pain  States she was told that she may have a slipped disc  Developed increased pain about 4 days ago  Low grade temp on arrival   Ambulates slowly to room

## 2020-11-27 NOTE — Discharge Instructions (Addendum)
Follow-up with your regular doctor.  Please call for an appointment.  There is any infection in your urine I will call you with the results and call in the antibiotic.  I do feel this is mostly muscle strain.  Inflammation in the lower back.  Apply ice to the lower back.  Take medication as prescribed.  You could also add lidocaine patches.

## 2021-05-25 ENCOUNTER — Other Ambulatory Visit: Payer: Self-pay

## 2021-05-25 ENCOUNTER — Emergency Department
Admission: EM | Admit: 2021-05-25 | Discharge: 2021-05-25 | Disposition: A | Discharge status: Home / Self Care | Payer: Medicare Other | Attending: Emergency Medicine | Admitting: Emergency Medicine

## 2021-05-25 DIAGNOSIS — Y907 Blood alcohol level of 200-239 mg/100 ml: Secondary | ICD-10-CM | POA: Insufficient documentation

## 2021-05-25 DIAGNOSIS — R000 Tachycardia, unspecified: Secondary | ICD-10-CM | POA: Diagnosis not present

## 2021-05-25 DIAGNOSIS — U071 COVID-19: Secondary | ICD-10-CM | POA: Insufficient documentation

## 2021-05-25 DIAGNOSIS — F4381 Prolonged grief disorder: Secondary | ICD-10-CM | POA: Diagnosis not present

## 2021-05-25 DIAGNOSIS — R780 Finding of alcohol in blood: Secondary | ICD-10-CM

## 2021-05-25 DIAGNOSIS — F4329 Adjustment disorder with other symptoms: Secondary | ICD-10-CM | POA: Diagnosis present

## 2021-05-25 DIAGNOSIS — T39392A Poisoning by other nonsteroidal anti-inflammatory drugs [NSAID], intentional self-harm, initial encounter: Secondary | ICD-10-CM | POA: Diagnosis not present

## 2021-05-25 LAB — CBC
HCT: 46.6 % — ABNORMAL HIGH (ref 36.0–46.0)
Hemoglobin: 15.7 g/dL — ABNORMAL HIGH (ref 12.0–15.0)
MCH: 30.3 pg (ref 26.0–34.0)
MCHC: 33.7 g/dL (ref 30.0–36.0)
MCV: 89.8 fL (ref 80.0–100.0)
Platelets: 356 10*3/uL (ref 150–400)
RBC: 5.19 MIL/uL — ABNORMAL HIGH (ref 3.87–5.11)
RDW: 14 % (ref 11.5–15.5)
WBC: 9.8 10*3/uL (ref 4.0–10.5)
nRBC: 0 % (ref 0.0–0.2)

## 2021-05-25 LAB — COMPREHENSIVE METABOLIC PANEL
ALT: 31 U/L (ref 0–44)
AST: 28 U/L (ref 15–41)
Albumin: 3.9 g/dL (ref 3.5–5.0)
Alkaline Phosphatase: 78 U/L (ref 38–126)
Anion gap: 12 (ref 5–15)
BUN: 11 mg/dL (ref 6–20)
CO2: 21 mmol/L — ABNORMAL LOW (ref 22–32)
Calcium: 8.7 mg/dL — ABNORMAL LOW (ref 8.9–10.3)
Chloride: 105 mmol/L (ref 98–111)
Creatinine, Ser: 0.83 mg/dL (ref 0.44–1.00)
GFR, Estimated: >60 mL/min (ref >60)
Glucose, Bld: 129 mg/dL — ABNORMAL HIGH (ref 70–99)
Potassium: 3.4 mmol/L — ABNORMAL LOW (ref 3.5–5.1)
Sodium: 138 mmol/L (ref 135–145)
Total Bilirubin: 0.5 mg/dL (ref 0.3–1.2)
Total Protein: 8.4 g/dL — ABNORMAL HIGH (ref 6.5–8.1)

## 2021-05-25 LAB — RESP PANEL BY RT-PCR (FLU A&B, COVID) ARPGX2
Influenza A by PCR: NEGATIVE
Influenza B by PCR: NEGATIVE
SARS Coronavirus 2 by RT PCR: POSITIVE — AB

## 2021-05-25 LAB — ETHANOL: Alcohol, Ethyl (B): 212 mg/dL — ABNORMAL HIGH (ref <10)

## 2021-05-25 LAB — ACETAMINOPHEN LEVEL: Acetaminophen (Tylenol), Serum: <10 ug/mL — ABNORMAL LOW (ref 10–30)

## 2021-05-25 LAB — SALICYLATE LEVEL: Salicylate Lvl: <7 mg/dL — ABNORMAL LOW (ref 7.0–30.0)

## 2021-05-25 NOTE — ED Notes (Signed)
Patient Items:  Black Dress Maroon Bra Black Nike Sandals Pink Panties Multicolored Socks One Gold Ring with Pink and [employer].

## 2021-05-25 NOTE — ED Provider Notes (Signed)
Patient was cleared by psychiatry.  Patient noted to have incidentally noted COVID-positive test.  When I reevaluated patient patient reports having some sniffling and cough for the past 7 days.  She is out of the window for antivirals and reports that she is feeling better and symptoms have all resolved.   I discussed the provisional nature of ED diagnosis, the treatment so far, the ongoing plan of care, follow up appointments and return precautions with the patient and any family or support people present. They expressed understanding and agreed with the plan, discharged home.      Concha Se, MD 05/25/21 1630

## 2021-05-25 NOTE — ED Triage Notes (Signed)
BIB [facility] from home for overdose of ibuprofen. Pt ingested 10, 200mg  ibuprofen approx 6AM in effort to "get rid of the pain". Pt states that she lost her mom in 2021 and has struggled since her death. States she does not want to die because she "wants to be there for my girls" but that she does want the pain of missing her mother to go away. No hx of suicidal attempts. Denies HI. Denies drug or etoh abuse. Daily cigarette smoker. Pt tearful at time of assessment, states that she regrets taking the ibuprofen this AM

## 2021-05-25 NOTE — ED Notes (Signed)
Lunch placed at bedside °

## 2021-05-25 NOTE — ED Notes (Signed)
EDP at bedside  

## 2021-05-25 NOTE — Discharge Instructions (Addendum)
COVID + test.   Call your insurance company for referral to therapists.  Go back to grief counseling  Can look up providers on website  [email or web address]

## 2021-05-25 NOTE — ED Provider Notes (Signed)
[facility] Provider Note    Event Date/Time   First MD Initiated Contact with Patient 05/25/21 1014     (approximate)   History   Drug Overdose   HPI  Sabrina Mata is a 41 y.o. female   here with overdose on ibuprofen.  Patient reportedly ingested 10 ibuprofen today in an attempt to "get rid of the pain."  She states that she has struggled since she lost her mother approximately 2 years ago.  She has 2 children and states that she very much wants to be around for them, states this was a spur the moment thing.  Denies drug or alcohol use.  No history of suicide attempts.  She states that she feels like she just let things get up and that she just needs someone to talk to.  She is glad that she did not take anything else more dangerous.  She regrets her actions.  Denies any other complaints.      Physical Exam   Triage Vital Signs: ED Triage Vitals  Enc Vitals Group     BP 05/25/21 0945 (!) 160/120     Pulse Rate 05/25/21 0945 (!) 128     Resp 05/25/21 0945 18     Temp 05/25/21 0945 98.2 F (36.8 C)     Temp src --      SpO2 05/25/21 0945 100 %     Weight 05/25/21 1000 249 lb 1.9 oz (113 kg)     Height 05/25/21 1000 5\' 1"  (1.549 m)     Head Circumference --      Peak Flow --      Pain Score 05/25/21 0959 0     Pain Loc --      Pain Edu? --      Excl. in GC? --     Most recent vital signs: Vitals:   05/25/21 1034 05/25/21 1519  BP: (!) 138/99 122/89  Pulse: (!) 110 92  Resp: 18 18  Temp:  98 F (36.7 C)  SpO2: 100% 100%     General: Awake, no distress.  CV:  Good peripheral perfusion.  Regular rate and rhythm.  No murmurs rubs. Resp:  Normal effort.  Lungs clear to auscultation bilaterally. Abd:  No distention.  No tenderness. Other:  Calm, depressed but denies ongoing suicidal ration.  No observation.   ED Results / Procedures / Treatments   Labs (all labs ordered are listed, but only abnormal results are  displayed) Labs Reviewed  RESP PANEL BY RT-PCR (FLU A&B, COVID) ARPGX2 - Abnormal; Notable for the following components:      Result Value   SARS Coronavirus 2 by RT PCR POSITIVE (*)    All other components within normal limits  COMPREHENSIVE METABOLIC PANEL - Abnormal; Notable for the following components:   Potassium 3.4 (*)    CO2 21 (*)    Glucose, Bld 129 (*)    Calcium 8.7 (*)    Total Protein 8.4 (*)    All other components within normal limits  ETHANOL - Abnormal; Notable for the following components:   Alcohol, Ethyl (B) 212 (*)    All other components within normal limits  SALICYLATE LEVEL - Abnormal; Notable for the following components:   Salicylate Lvl <7.0 (*)    All other components within normal limits  ACETAMINOPHEN LEVEL - Abnormal; Notable for the following components:   Acetaminophen (Tylenol), Serum <10 (*)    All other components within normal  limits  CBC - Abnormal; Notable for the following components:   RBC 5.19 (*)    Hemoglobin 15.7 (*)    HCT 46.6 (*)    All other components within normal limits  URINE DRUG SCREEN, QUALITATIVE ([facility] ONLY)  POC URINE PREG, ED     EKG Sinus tachycardia, ventricular rate 102.  PR 148, QRS 82, QTc 495.  No acute ST elevations or depressions.   RADIOLOGY     PROCEDURES:  Critical Care performed: No     MEDICATIONS ORDERED IN ED: Medications - No data to display   IMPRESSION / MDM / ASSESSMENT AND PLAN / ED COURSE  I reviewed the triage vital signs and the nursing notes.                               MDM:  41 yo F here with suicide attempt with ibuprofen. Pt now regretful and reports multiple protective factors. From OD perspective. Labs are reassuring. Cr normal. No n/v or signs of gastritis or other GI distress. Medically cleared. EtOH elevated as well. COVID incidentally positive - denies any cough, SOB, hypoxia, or resp distress.  Pt will be monitored for sobriety, and psych consulted for  evaluation. From a COVID perspective, she has no high risk symptoms and is HDS, not hypoxic, and is otherwise in no distress.   MEDICATIONS GIVEN IN ED: Medications - No data to display   Consults:  Psychiatry   EMR reviewed  ED visits reviewed [facility] visits with Kathreen Cosier 11/2019 for bipolar II disorder     FINAL CLINICAL IMPRESSION(S) / ED DIAGNOSES   Final diagnoses:  Elevated blood alcohol level, blood alcohol level not specified     Rx / DC Orders   ED Discharge Orders     None        Note:  This document was prepared using Dragon voice recognition software and may include unintentional dictation errors.   Shaune Pollack, MD 05/25/21 1958

## 2021-05-25 NOTE — Consult Note (Signed)
[facility] Face-to-Face Psychiatry Consult   Reason for Consult:  intentional OD of ibuprofen Referring Physician:  EDP Patient Identification: Sabrina Mata MRN:  [id number] Principal Diagnosis: Grief reaction with prolonged bereavement Diagnosis:  Principal Problem:   Grief reaction with prolonged bereavement   Total Time spent with patient: 1 hour  Subjective:   Sabrina Mata Jessie is a 41 y.o. female patient admitted voluntarily after taking ibuprofen and immediately inducing vomiting.    HPI: Patient was seen and chart reviewed.  Patient reports "I want to live.  This was just very impulsive.  I want to go home and take care of my daughters." Patient presented to the emergency room voluntarily after an argument with her significant other in which she took ten 200 mg ibuprofen.  Patient states that she immediately induced vomiting and called her cousin to bring her to the emergency department.  Patient states that when she and her partner were arguing she "wanted to call my mom" but I could not because she passed away from [place] in 07-31-2019.  Patient reports several other family members have passed away in the last 2 years.  Support and encouragement given to patient.  Patient states that when her mother died she did receive some grief counseling through the funeral home that took care of her mother's arrangements for burial.  She states that that was very helpful and agreed that she would return.   Patient is calm and cooperative at time of the evaluation.  She does not wish to be hospitalized psychiatrically.  Of note, patient did immediately vomit that ibuprofen, called for help, and came to the emergency department.  Patient denies any current thoughts of suicide or self-harm in any way.  She is future oriented, stating that she has a lot to live for and she needs to be there for her 41 year old and 52 year old daughters.  She states that she feels very embarrassed about what she did.   She states that she realizes now that she needs to go back to grief counseling and seek  a therapist since she can see on a regular basis. We discussed that it would be very helpful to find an individual therapist.  Patient is on Medicare due to being on disability.  Patient states that her daughter sees a group in [place] and she believes they will take Medicare.  Writer discussed getting on the website [email or web address] to match her needs to a therapist.  Patient is calm and positive and could not be considered for inpatient psychiatric hospitalization anywhere for at least 5 days.  It would be of little benefit to keep in the ED, where she would not receive benefit of any group therapy.  Patient does not wish to be on any medication for depression.   Collateral from partner: Sibyl Parr: [id number]: He is comfortable for her to go home and follow up with outpatient provider. He does not see this as a serious suicide attempt. They got into an argument, and he believes this was a reaction to it in and impulsive. He will come pick her up. Advised him of calling her insurance provider about referrals and [email or web address]  Past Psychiatric History: No past psychiatric hospitalizations.  PTSD, anxiety,  Risk to Self:   Risk to Others:   Prior Inpatient Therapy:   Prior Outpatient Therapy:    Past Medical History:  Past Medical History:  Diagnosis Date   Anxiety    Bipolar 1 disorder, depressed ([place])    Depression  Past Surgical History:  Procedure Laterality Date   CESAREAN SECTION     times 2   SHOULDER SURGERY Right    Family History:  Family History  Problem Relation Age of Onset   COPD Mother    Heart disease Mother    Drug abuse Mother    Sickle cell trait Brother    Family Psychiatric  History: none reported Social History:  Social History   Substance and Sexual Activity  Alcohol Use Yes   Alcohol/week: 2.0 standard drinks   Types: 2 Shots of liquor per week    Comment: patient drinks 1-3x per week      Social History   Substance and Sexual Activity  Drug Use Not Currently    Social History   Socioeconomic History   Marital status: Single    Spouse name: NA   Number of children: 2   Years of education: 11   Highest education level: GED or equivalent  Occupational History   Occupation: unemployed   Occupation: SSI   Tobacco Use   Smoking status: Every Day    Packs/day: 0.50    Years: 20.00    Pack years: 10.00    Types: Cigarettes   Smokeless tobacco: Never   Tobacco comments:    denies 2nd smoke  Vaping Use   Vaping Use: Never used  Substance and Sexual Activity   Alcohol use: Yes    Alcohol/week: 2.0 standard drinks    Types: 2 Shots of liquor per week    Comment: patient drinks 1-3x per week    Drug use: Not Currently   Sexual activity: Not on file  Other Topics Concern   Not on file  Social History Narrative   Patient is a single mom of two children ages 63 and 72yo. She receives disability due to her mental health diagnoses. She has one close friend and has a supportive friendship with the father of her children. She lost 4 loved ones due to Covid-19 from 04/2019 - 06/2019, including her grandmother, her mother, her uncle and her mother's boyfriend. She also recently suffered a miscarriage.    Social Determinants of Health   Financial Resource Strain: Not on file  Food Insecurity: Not on file  Transportation Needs: Not on file  Physical Activity: Not on file  Stress: Not on file  Social Connections: Not on file   Additional Social History:    Allergies:  No Known Allergies  Labs:  Results for orders placed or performed during the hospital encounter of 05/25/21 (from the past 48 hour(s))  Comprehensive metabolic panel     Status: Abnormal   Collection Time: 05/25/21 10:00 AM  Result Value Ref Range   Sodium 138 135 - 145 mmol/L   Potassium 3.4 (L) 3.5 - 5.1 mmol/L   Chloride 105 98 - 111 mmol/L   CO2 21 (L)  22 - 32 mmol/L   Glucose, Bld 129 (H) 70 - 99 mg/dL    Comment: Glucose reference range applies only to samples taken after fasting for at least 8 hours.   BUN 11 6 - 20 mg/dL   Creatinine, Ser 0.83 0.44 - 1.00 mg/dL   Calcium 8.7 (L) 8.9 - 10.3 mg/dL   Total Protein 8.4 (H) 6.5 - 8.1 g/dL   Albumin 3.9 3.5 - 5.0 g/dL   AST 28 15 - 41 U/L   ALT 31 0 - 44 U/L   Alkaline Phosphatase 78 38 - 126 U/L   Total Bilirubin 0.5 0.3 -  1.2 mg/dL   GFR, Estimated >60 >60 mL/min    Comment: (NOTE) Calculated using the CKD-EPI Creatinine Equation (2021)    Anion gap 12 5 - 15    Comment: Performed at [facility], [address] [place], [place] [address]  Ethanol     Status: Abnormal   Collection Time: 05/25/21 10:00 AM  Result Value Ref Range   Alcohol, Ethyl (B) 212 (H) <10 mg/dL    Comment: (NOTE) Lowest detectable limit for serum alcohol is 10 mg/dL.  For medical purposes only. Performed at [facility], [address] [place], [place] [id number]   Salicylate level     Status: Abnormal   Collection Time: 05/25/21 10:00 AM  Result Value Ref Range   Salicylate Lvl [id number] (L) 7.0 - 30.0 mg/dL    Comment: Performed at [facility], [address] [place], [place] [address]  Acetaminophen level     Status: Abnormal   Collection Time: 05/25/21 10:00 AM  Result Value Ref Range   Acetaminophen (Tylenol), Serum <10 (L) 10 - 30 ug/mL    Comment: (NOTE) Therapeutic concentrations vary significantly. A range of 10-30 ug/mL  may be an effective concentration for many patients. However, some  are best treated at concentrations outside of this range. Acetaminophen concentrations >150 ug/mL at 4 hours after ingestion  and >50 ug/mL at 12 hours after ingestion are often associated with  toxic reactions.  Performed at [facility], [address] [place], [place] [address]   cbc     Status: Abnormal   Collection Time: 05/25/21 10:00 AM  Result Value Ref  Range   WBC 9.8 4.0 - 10.5 K/uL   RBC 5.19 (H) 3.87 - 5.11 MIL/uL   Hemoglobin 15.7 (H) 12.0 - 15.0 g/dL   HCT 46.6 (H) 36.0 - 46.0 %   MCV 89.8 80.0 - 100.0 fL   MCH 30.3 26.0 - 34.0 pg   MCHC 33.7 30.0 - 36.0 g/dL   RDW 14.0 11.5 - 15.5 %   Platelets 356 150 - 400 K/uL   nRBC 0.0 0.0 - 0.2 %    Comment: Performed at [facility], [address] [place], [place] [address]  Resp Panel by RT-PCR (Flu A&B, Covid) Nasopharyngeal Swab     Status: Abnormal   Collection Time: 05/25/21 10:00 AM   Specimen: Nasopharyngeal Swab; Nasopharyngeal(NP) swabs in vial transport medium  Result Value Ref Range   SARS Coronavirus 2 by RT PCR POSITIVE (A) NEGATIVE    Comment: (NOTE) SARS-CoV-2 target nucleic acids are DETECTED.  The SARS-CoV-2 RNA is generally detectable in upper respiratory specimens during the acute phase of infection. Positive results are indicative of the presence of the identified virus, but do not rule out bacterial infection or co-infection with other pathogens not detected by the test. Clinical correlation with patient history and other diagnostic information is necessary to determine patient infection status. The expected result is Negative.  Fact Sheet for Patients: EntrepreneurPulse.com.au  Fact Sheet for Healthcare Providers: IncredibleEmployment.be  This test is not yet approved or cleared by the [place] FDA and  has been authorized for detection and/or diagnosis of SARS-CoV-2 by FDA under an Emergency Use Authorization (EUA).  This EUA will remain in effect (meaning this test can be used) for the duration of  the COVID-19 declaration under Section 564(b)(1) of the A ct, 21 U.S.C. section 360bbb-3(b)(1), unless the authorization is terminated or revoked sooner.     Influenza A by  PCR NEGATIVE NEGATIVE   Influenza B by PCR NEGATIVE NEGATIVE    Comment: (NOTE) The Xpert Xpress SARS-CoV-2/FLU/RSV plus assay is  intended as an aid in the diagnosis of influenza from Nasopharyngeal swab specimens and should not be used as a sole basis for treatment. Nasal washings and aspirates are unacceptable for Xpert Xpress SARS-CoV-2/FLU/RSV testing.  Fact Sheet for Patients: BloggerCourse.com  Fact Sheet for Healthcare Providers: SeriousBroker.it  This test is not yet approved or cleared by the [place] FDA and has been authorized for detection and/or diagnosis of SARS-CoV-2 by FDA under an Emergency Use Authorization (EUA). This EUA will remain in effect (meaning this test can be used) for the duration of the COVID-19 declaration under Section 564(b)(1) of the Act, 21 U.S.C. section 360bbb-3(b)(1), unless the authorization is terminated or revoked.  Performed at [facility], [address] [place], [place] [address]     No current facility-administered medications for this encounter.   Current Outpatient Medications  Medication Sig Dispense Refill   meloxicam (MOBIC) 15 MG tablet Take 1 tablet (15 mg total) by mouth daily. (Patient not taking: Reported on 05/25/2021) 30 tablet 2    Musculoskeletal: Strength & Muscle Tone: within normal limits Gait & Station: normal Patient leans: N/A Psychiatric Specialty Exam:  Presentation  General Appearance: Appropriate for Environment Eye Contact:Good Speech:Clear and Coherent Speech Volume:Normal Handedness:No data recorded  Mood and Affect  Mood:Euthymic Affect:Appropriate  Thought Process  Thought Processes:Coherent Descriptions of Associations:Intact  Orientation:Full (Time, Place and Person)  Thought Content:WDL  History of Schizophrenia/Schizoaffective disorder:No data recorded Duration of Psychotic Symptoms:No data recorded Hallucinations:Hallucinations: None  Ideas of Reference:None  Suicidal Thoughts:Suicidal Thoughts: No  Homicidal Thoughts:Homicidal Thoughts:  No   Sensorium  Memory:Immediate Good; Recent Good; Remote Good Judgment:Fair Insight:Fair  Executive Functions  Concentration:Good Attention Span:Good Recall:Good Fund of Knowledge:Good Language:Good  Psychomotor Activity  Psychomotor Activity:Psychomotor Activity: Normal  Assets  Assets:Communication Skills; Desire for Improvement; Financial Resources/Insurance; Housing; Social Support; Resilience; Physical Health  Sleep  Sleep:Sleep: Good  Physical Exam: Physical Exam Vitals and nursing note reviewed.  HENT:     Head: Normocephalic.     Nose: No congestion or rhinorrhea.  Eyes:     General:        Right eye: No discharge.        Left eye: No discharge.  Cardiovascular:     Rate and Rhythm: Normal rate.  Pulmonary:     Effort: Pulmonary effort is normal.  Musculoskeletal:        General: Normal range of motion.     Cervical back: Normal range of motion.  Skin:    General: Skin is dry.  Neurological:     Mental Status: She is alert and oriented to person, place, and time.  Psychiatric:        Attention and Perception: Attention normal.        Mood and Affect: Mood normal.        Speech: Speech normal.        Behavior: Behavior is cooperative.        Thought Content: Thought content normal.        Cognition and Memory: Cognition normal.   Review of Systems  Psychiatric/Behavioral:  Positive for depression. Negative for hallucinations, memory loss, substance abuse and suicidal ideas (denies currently). The patient is not nervous/anxious and does not have insomnia.   All other systems reviewed and are negative. Blood pressure 122/89, pulse 92, temperature 98 F (36.7 C), temperature source  Oral, resp. rate 18, height 5\' 1"  (1.549 m), weight 113 kg, SpO2 100 %. Body mass index is 47.07 kg/m.  Treatment Plan Summary: Plan 41 year old woman who presented voluntarily to the ED after taking approximately 10 200 mg ibuprofen after an argument with her significant  other.  Patient immediately induced vomiting by putting her finger down her throat and called her cousin.  Patient expresses that she wants to return home.  Patient is very remorseful and expresses desire to live.    Patient will initiate grief counseling associated with funeral home that made arrangements for her mother.  Resources given for outpatient therapists.  Patient to investigate who takes her insurance.  She will also check website [email or web address] for provider, as well as check with the provider group that sees her children.  Disposition: Supportive therapy provided about ongoing stressors. Discussed crisis plan, support from social network, calling 911, coming to the Emergency Department, and calling Suicide Hotline.Patient is also positive for COVID-19.  Due to this she would have to remain in the emergency department for a minimum of 5 days before being admitted to an inpatient unit.  The circumstance would not provide benefit, as there would be no psychological services.  Patient does not wish to be on any medication for depression.  Reviewed with EDP.  Sherlon Handing, NP 05/25/2021 7:21 PM

## 2021-05-25 NOTE — ED Notes (Signed)
Psychiatry messaged in regard to assessing patient. Pt stating to EDT that she is wanting to leave but MD is concerned for safety.

## 2021-05-25 NOTE — ED Notes (Signed)
Sent swab to lab

## 2022-03-25 IMAGING — US US OB < 14 WEEKS - US OB TV
1 series · 14 of 28 positions shown · non-contrast
Comparison: None.

CLINICAL DATA: Vaginal bleeding and first-trimester pregnancy

EXAM:
OBSTETRIC <14 WK US AND TRANSVAGINAL OB US
TECHNIQUE: Both transabdominal and transvaginal ultrasound examinations were
performed for complete evaluation of the gestation as well as the
maternal uterus, adnexal regions, and pelvic cul-de-sac.
Transvaginal technique was performed to assess early pregnancy.

[Series 1: ob us · 81 acquisitions, 14 frames shown]
[im 3/81]
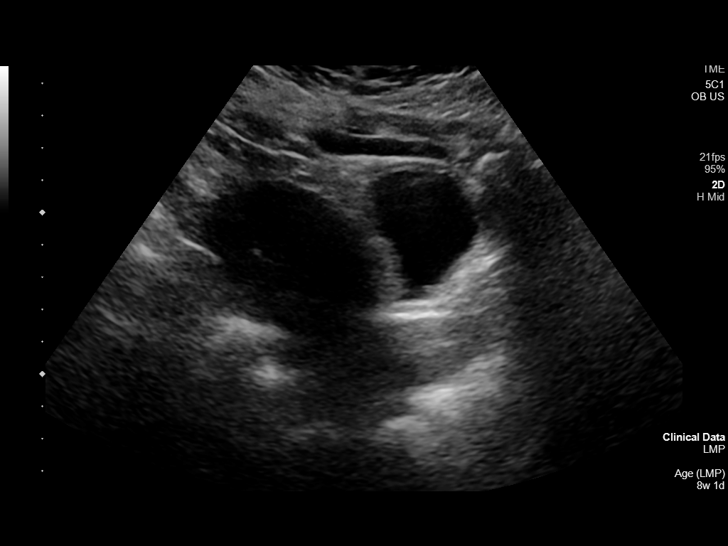
[im 9/81]
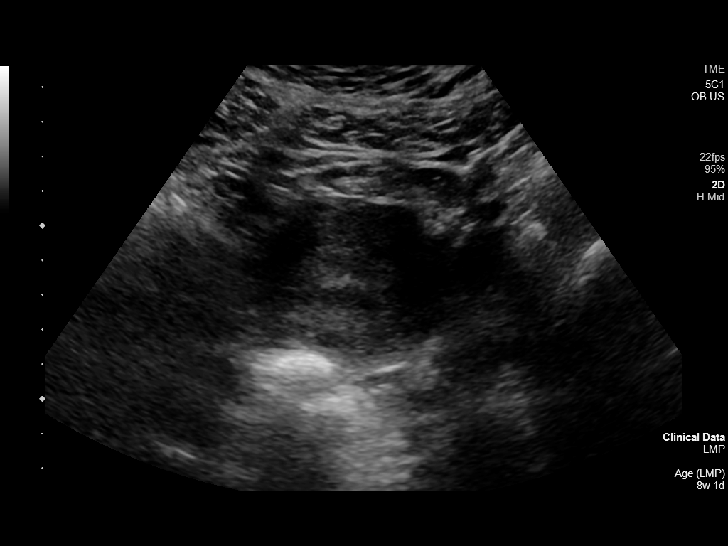
[im 15/81]
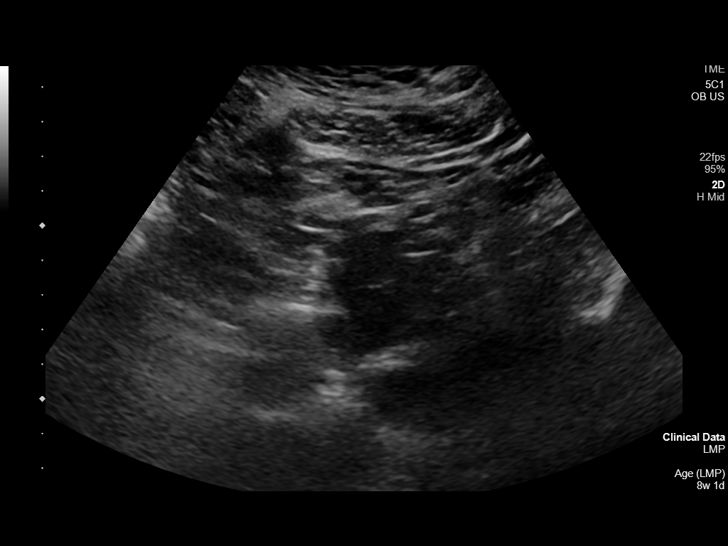
[im 21/81]
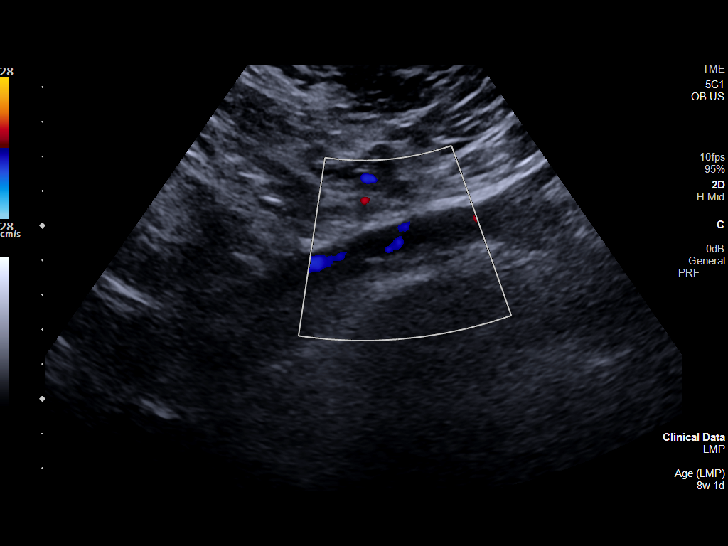
[im 27/81]
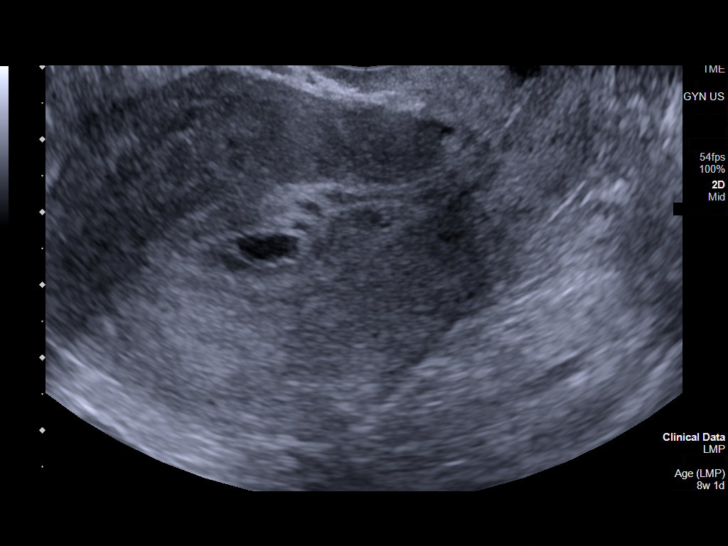
[im 33/81]
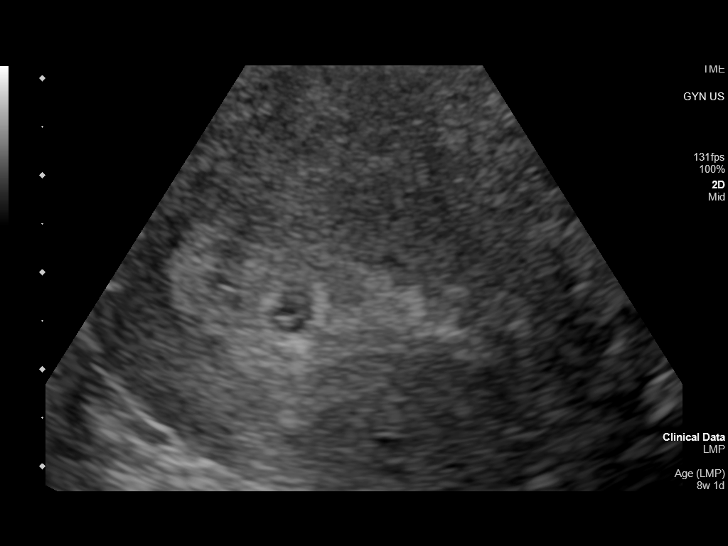
[im 39/81]
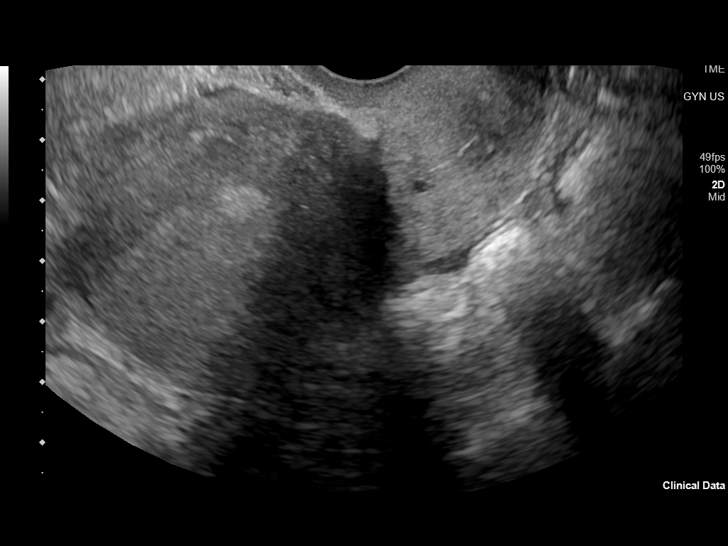
[im 45/81]
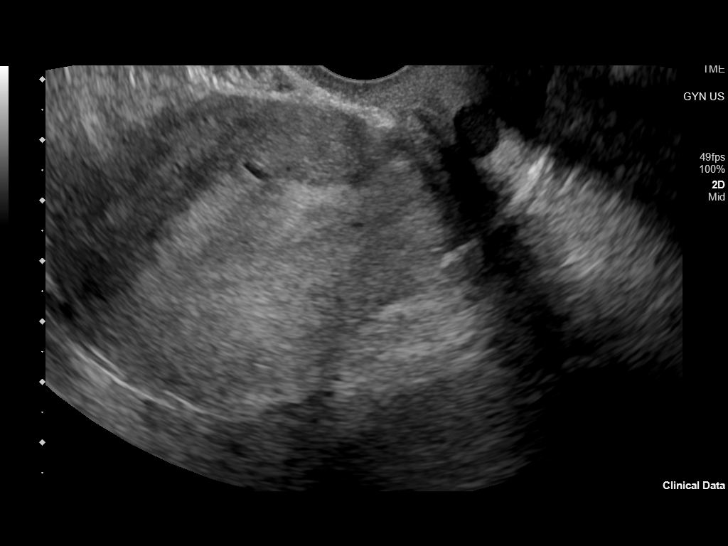
[im 51/81]
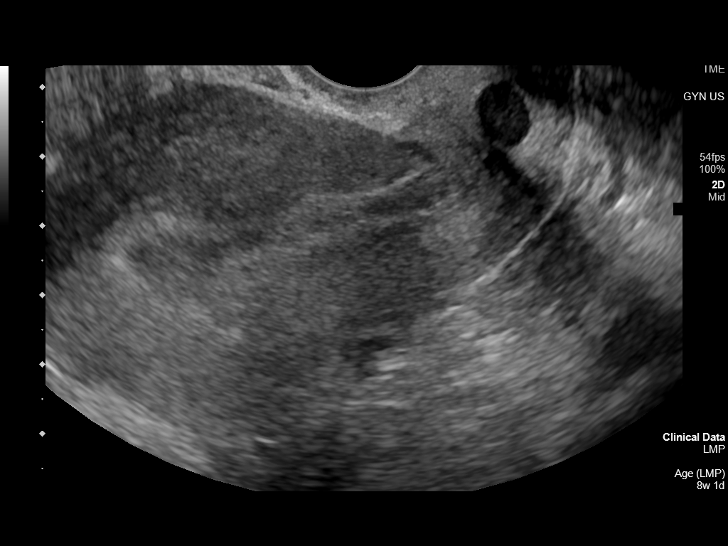
[im 57/81]
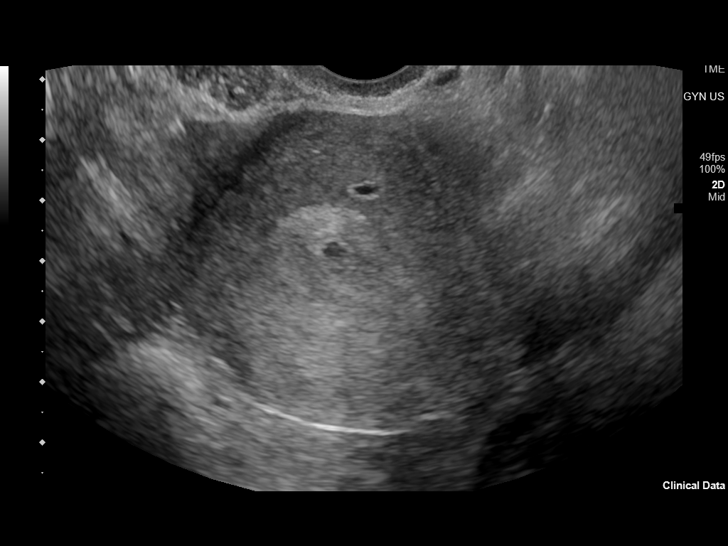
[im 63/81]
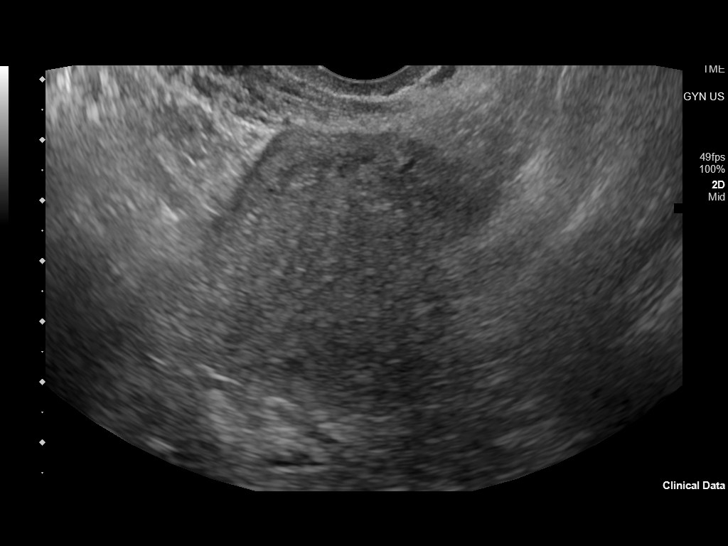
[im 69/81]
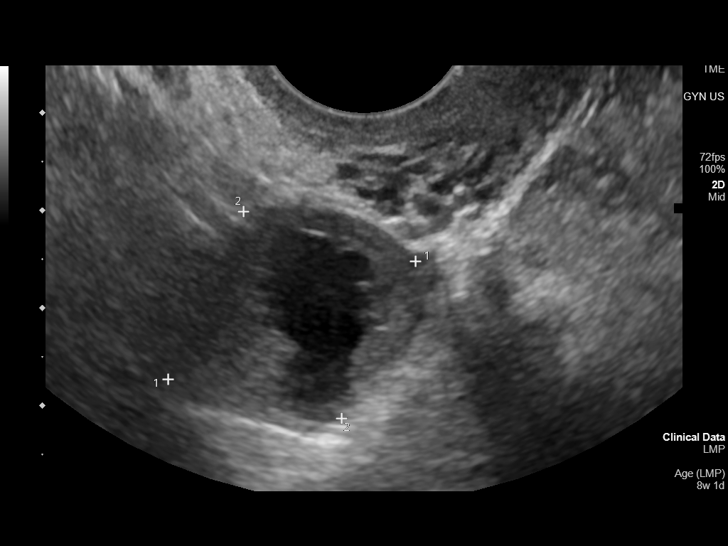
[im 75/81]
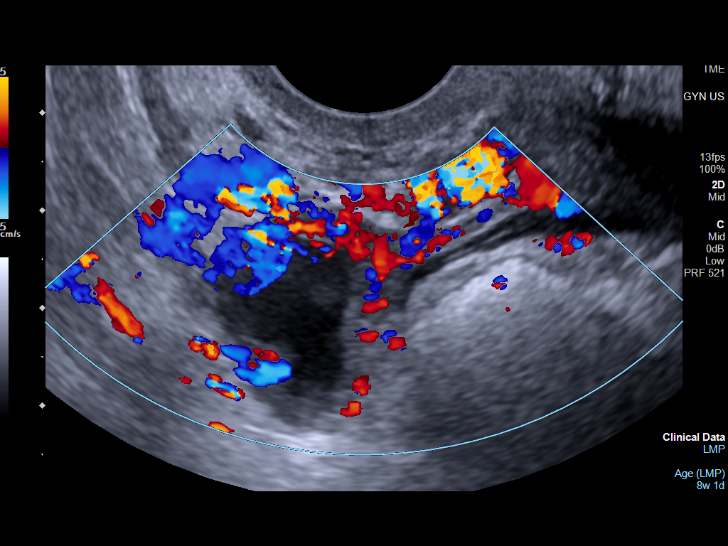
[im 81/81]
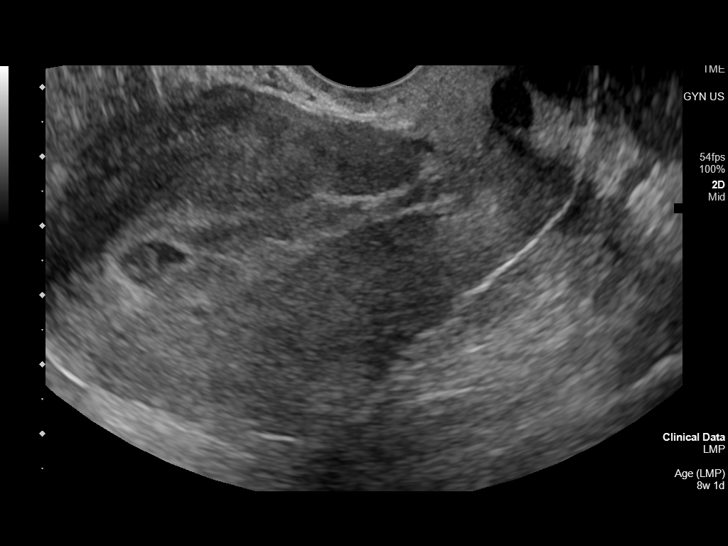

[14 of 28 positions shown; findings below may reference images not displayed]

FINDINGS: Intrauterine gestational sac: Single

Yolk sac:  Visualized.

Embryo:  Not Visualized.

MSD: 8.9 mm   5 w   5 d

Subchorionic hemorrhage:  None visualized.

Maternal uterus/adnexae: Presumed corpus luteum on the right.
Incidental subendometrial cyst.
IMPRESSION: Intrauterine gestational sac with yolk sac but no visualized embryo,
5 weeks 5 days by mean sac diameter.

## 2022-08-07 ENCOUNTER — Emergency Department: Payer: Medicare Other

## 2022-08-07 ENCOUNTER — Other Ambulatory Visit: Payer: Self-pay

## 2022-08-07 DIAGNOSIS — I110 Hypertensive heart disease with heart failure: Principal | ICD-10-CM | POA: Diagnosis present

## 2022-08-07 DIAGNOSIS — R918 Other nonspecific abnormal finding of lung field: Secondary | ICD-10-CM | POA: Diagnosis present

## 2022-08-07 DIAGNOSIS — I5041 Acute combined systolic (congestive) and diastolic (congestive) heart failure: Secondary | ICD-10-CM | POA: Diagnosis present

## 2022-08-07 DIAGNOSIS — Z79899 Other long term (current) drug therapy: Secondary | ICD-10-CM

## 2022-08-07 DIAGNOSIS — Z6841 Body Mass Index (BMI) 40.0 and over, adult: Secondary | ICD-10-CM

## 2022-08-07 DIAGNOSIS — Z560 Unemployment, unspecified: Secondary | ICD-10-CM

## 2022-08-07 DIAGNOSIS — F419 Anxiety disorder, unspecified: Secondary | ICD-10-CM | POA: Diagnosis present

## 2022-08-07 DIAGNOSIS — Z8249 Family history of ischemic heart disease and other diseases of the circulatory system: Secondary | ICD-10-CM

## 2022-08-07 DIAGNOSIS — I420 Dilated cardiomyopathy: Secondary | ICD-10-CM | POA: Diagnosis present

## 2022-08-07 DIAGNOSIS — F1010 Alcohol abuse, uncomplicated: Secondary | ICD-10-CM | POA: Diagnosis present

## 2022-08-07 DIAGNOSIS — J9601 Acute respiratory failure with hypoxia: Secondary | ICD-10-CM | POA: Diagnosis present

## 2022-08-07 DIAGNOSIS — Z832 Family history of diseases of the blood and blood-forming organs and certain disorders involving the immune mechanism: Secondary | ICD-10-CM

## 2022-08-07 DIAGNOSIS — E785 Hyperlipidemia, unspecified: Secondary | ICD-10-CM | POA: Diagnosis present

## 2022-08-07 DIAGNOSIS — Z7984 Long term (current) use of oral hypoglycemic drugs: Secondary | ICD-10-CM

## 2022-08-07 DIAGNOSIS — Z8616 Personal history of COVID-19: Secondary | ICD-10-CM

## 2022-08-07 DIAGNOSIS — F319 Bipolar disorder, unspecified: Secondary | ICD-10-CM | POA: Diagnosis present

## 2022-08-07 DIAGNOSIS — Z825 Family history of asthma and other chronic lower respiratory diseases: Secondary | ICD-10-CM

## 2022-08-07 DIAGNOSIS — I959 Hypotension, unspecified: Secondary | ICD-10-CM | POA: Diagnosis not present

## 2022-08-07 DIAGNOSIS — Z1152 Encounter for screening for COVID-19: Secondary | ICD-10-CM

## 2022-08-07 DIAGNOSIS — I2510 Atherosclerotic heart disease of native coronary artery without angina pectoris: Secondary | ICD-10-CM | POA: Diagnosis present

## 2022-08-07 DIAGNOSIS — R0602 Shortness of breath: Secondary | ICD-10-CM | POA: Diagnosis not present

## 2022-08-07 DIAGNOSIS — F17210 Nicotine dependence, cigarettes, uncomplicated: Secondary | ICD-10-CM | POA: Diagnosis present

## 2022-08-07 LAB — CBC WITH DIFFERENTIAL/PLATELET
Abs Immature Granulocytes: 0.03 10*3/uL (ref 0.00–0.07)
Basophils Absolute: 0.1 10*3/uL (ref 0.0–0.1)
Basophils Relative: 1 %
Eosinophils Absolute: 0.2 10*3/uL (ref 0.0–0.5)
Eosinophils Relative: 2 %
HCT: 43.2 % (ref 36.0–46.0)
Hemoglobin: 14.4 g/dL (ref 12.0–15.0)
Immature Granulocytes: 0 %
Lymphocytes Relative: 27 %
Lymphs Abs: 3.4 10*3/uL (ref 0.7–4.0)
MCH: 30.5 pg (ref 26.0–34.0)
MCHC: 33.3 g/dL (ref 30.0–36.0)
MCV: 91.5 fL (ref 80.0–100.0)
Monocytes Absolute: 0.8 10*3/uL (ref 0.1–1.0)
Monocytes Relative: 6 %
Neutro Abs: 8 10*3/uL — ABNORMAL HIGH (ref 1.7–7.7)
Neutrophils Relative %: 64 %
Platelets: 364 10*3/uL (ref 150–400)
RBC: 4.72 MIL/uL (ref 3.87–5.11)
RDW: 14 % (ref 11.5–15.5)
WBC: 12.6 10*3/uL — ABNORMAL HIGH (ref 4.0–10.5)
nRBC: 0 % (ref 0.0–0.2)

## 2022-08-07 LAB — COMPREHENSIVE METABOLIC PANEL
ALT: 37 U/L (ref 0–44)
AST: 34 U/L (ref 15–41)
Albumin: 3.7 g/dL (ref 3.5–5.0)
Alkaline Phosphatase: 79 U/L (ref 38–126)
Anion gap: 7 (ref 5–15)
BUN: 13 mg/dL (ref 6–20)
CO2: 27 mmol/L (ref 22–32)
Calcium: 8.6 mg/dL — ABNORMAL LOW (ref 8.9–10.3)
Chloride: 104 mmol/L (ref 98–111)
Creatinine, Ser: 1.09 mg/dL — ABNORMAL HIGH (ref 0.44–1.00)
GFR, Estimated: >60 mL/min (ref >60)
Glucose, Bld: 106 mg/dL — ABNORMAL HIGH (ref 70–99)
Potassium: 3.5 mmol/L (ref 3.5–5.1)
Sodium: 138 mmol/L (ref 135–145)
Total Bilirubin: 0.8 mg/dL (ref 0.3–1.2)
Total Protein: 7.5 g/dL (ref 6.5–8.1)

## 2022-08-07 LAB — TROPONIN I (HIGH SENSITIVITY): Troponin I (High Sensitivity): 12 ng/L (ref <18)

## 2022-08-07 NOTE — ED Triage Notes (Signed)
Ambulatory to triage with c/o sob x several days. States she feels more winded when performing normal activities and feels as though her breathing is moire shallow.  Denies chest pain.

## 2022-08-08 ENCOUNTER — Inpatient Hospital Stay
Admission: EM | Admit: 2022-08-08 | Discharge: 2022-08-10 | DRG: 291 | Disposition: A | Discharge status: Home / Self Care | Payer: Medicare Other | Attending: Osteopathic Medicine | Admitting: Osteopathic Medicine

## 2022-08-08 ENCOUNTER — Encounter: Payer: Self-pay | Admitting: Family Medicine

## 2022-08-08 ENCOUNTER — Other Ambulatory Visit: Payer: Self-pay

## 2022-08-08 ENCOUNTER — Emergency Department: Payer: Medicare Other

## 2022-08-08 DIAGNOSIS — J9601 Acute respiratory failure with hypoxia: Secondary | ICD-10-CM | POA: Diagnosis present

## 2022-08-08 DIAGNOSIS — F1010 Alcohol abuse, uncomplicated: Secondary | ICD-10-CM | POA: Diagnosis present

## 2022-08-08 DIAGNOSIS — Z832 Family history of diseases of the blood and blood-forming organs and certain disorders involving the immune mechanism: Secondary | ICD-10-CM | POA: Diagnosis not present

## 2022-08-08 DIAGNOSIS — R918 Other nonspecific abnormal finding of lung field: Secondary | ICD-10-CM | POA: Diagnosis present

## 2022-08-08 DIAGNOSIS — I420 Dilated cardiomyopathy: Secondary | ICD-10-CM | POA: Diagnosis present

## 2022-08-08 DIAGNOSIS — F419 Anxiety disorder, unspecified: Secondary | ICD-10-CM | POA: Diagnosis present

## 2022-08-08 DIAGNOSIS — I110 Hypertensive heart disease with heart failure: Secondary | ICD-10-CM | POA: Diagnosis present

## 2022-08-08 DIAGNOSIS — I5041 Acute combined systolic (congestive) and diastolic (congestive) heart failure: Secondary | ICD-10-CM | POA: Diagnosis present

## 2022-08-08 DIAGNOSIS — I959 Hypotension, unspecified: Secondary | ICD-10-CM | POA: Diagnosis not present

## 2022-08-08 DIAGNOSIS — E785 Hyperlipidemia, unspecified: Secondary | ICD-10-CM | POA: Diagnosis present

## 2022-08-08 DIAGNOSIS — F17210 Nicotine dependence, cigarettes, uncomplicated: Secondary | ICD-10-CM | POA: Diagnosis present

## 2022-08-08 DIAGNOSIS — Z8249 Family history of ischemic heart disease and other diseases of the circulatory system: Secondary | ICD-10-CM | POA: Diagnosis not present

## 2022-08-08 DIAGNOSIS — R0602 Shortness of breath: Secondary | ICD-10-CM | POA: Diagnosis present

## 2022-08-08 DIAGNOSIS — Z79899 Other long term (current) drug therapy: Secondary | ICD-10-CM | POA: Diagnosis not present

## 2022-08-08 DIAGNOSIS — E782 Mixed hyperlipidemia: Secondary | ICD-10-CM | POA: Diagnosis not present

## 2022-08-08 DIAGNOSIS — I5021 Acute systolic (congestive) heart failure: Secondary | ICD-10-CM | POA: Diagnosis not present

## 2022-08-08 DIAGNOSIS — Z6841 Body Mass Index (BMI) 40.0 and over, adult: Secondary | ICD-10-CM | POA: Diagnosis not present

## 2022-08-08 DIAGNOSIS — I509 Heart failure, unspecified: Principal | ICD-10-CM

## 2022-08-08 DIAGNOSIS — F17200 Nicotine dependence, unspecified, uncomplicated: Secondary | ICD-10-CM | POA: Diagnosis not present

## 2022-08-08 DIAGNOSIS — Z825 Family history of asthma and other chronic lower respiratory diseases: Secondary | ICD-10-CM | POA: Diagnosis not present

## 2022-08-08 DIAGNOSIS — Z7984 Long term (current) use of oral hypoglycemic drugs: Secondary | ICD-10-CM | POA: Diagnosis not present

## 2022-08-08 DIAGNOSIS — Z8616 Personal history of COVID-19: Secondary | ICD-10-CM | POA: Diagnosis not present

## 2022-08-08 DIAGNOSIS — Z560 Unemployment, unspecified: Secondary | ICD-10-CM | POA: Diagnosis not present

## 2022-08-08 DIAGNOSIS — F319 Bipolar disorder, unspecified: Secondary | ICD-10-CM | POA: Diagnosis present

## 2022-08-08 DIAGNOSIS — Z1152 Encounter for screening for COVID-19: Secondary | ICD-10-CM | POA: Diagnosis not present

## 2022-08-08 DIAGNOSIS — I2510 Atherosclerotic heart disease of native coronary artery without angina pectoris: Secondary | ICD-10-CM | POA: Diagnosis present

## 2022-08-08 LAB — HIV ANTIBODY (ROUTINE TESTING W REFLEX): HIV Screen 4th Generation wRfx: NONREACTIVE

## 2022-08-08 LAB — POC URINE PREG, ED: Preg Test, Ur: NEGATIVE

## 2022-08-08 LAB — LIPID PANEL
Cholesterol: 236 mg/dL — ABNORMAL HIGH (ref 0–200)
HDL: 33 mg/dL — ABNORMAL LOW (ref >40)
LDL Cholesterol: 178 mg/dL — ABNORMAL HIGH (ref 0–99)
Total CHOL/HDL Ratio: 7.2 RATIO
Triglycerides: 126 mg/dL (ref <150)
VLDL: 25 mg/dL (ref 0–40)

## 2022-08-08 LAB — TROPONIN I (HIGH SENSITIVITY)
Troponin I (High Sensitivity): 15 ng/L (ref <18)
Troponin I (High Sensitivity): 14 ng/L (ref <18)

## 2022-08-08 LAB — ETHANOL: Alcohol, Ethyl (B): <10 mg/dL (ref <10)

## 2022-08-08 LAB — TSH: TSH: 2.099 u[IU]/mL (ref 0.350–4.500)

## 2022-08-08 LAB — SARS CORONAVIRUS 2 BY RT PCR: SARS Coronavirus 2 by RT PCR: NEGATIVE

## 2022-08-08 LAB — BRAIN NATRIURETIC PEPTIDE: B Natriuretic Peptide: 277.6 pg/mL — ABNORMAL HIGH (ref 0.0–100.0)

## 2022-08-08 MED ORDER — SODIUM CHLORIDE 0.9% FLUSH: 3.0000 mL | INTRAVENOUS | Status: DC | PRN

## 2022-08-08 MED ORDER — FUROSEMIDE 10 MG/ML IJ SOLN
40.0000 mg | Freq: Once | INTRAMUSCULAR | Status: AC
  Administered 2022-08-08: 40 mg via INTRAVENOUS
  Filled 2022-08-08: qty 4

## 2022-08-08 MED ORDER — FUROSEMIDE 10 MG/ML IJ SOLN
40.0000 mg | Freq: Every day | INTRAMUSCULAR | Status: DC
  Administered 2022-08-09: 40 mg via INTRAVENOUS
  Filled 2022-08-08: qty 4

## 2022-08-08 MED ORDER — SODIUM CHLORIDE 0.9 % IV SOLN: 250.0000 mL | INTRAVENOUS | Status: DC | PRN

## 2022-08-08 MED ORDER — IOHEXOL 350 MG/ML SOLN
100.0000 mL | Freq: Once | INTRAVENOUS | Status: AC | PRN
  Administered 2022-08-08: 100 mL via INTRAVENOUS

## 2022-08-08 MED ORDER — THIAMINE HCL 100 MG/ML IJ SOLN
100.0000 mg | Freq: Every day | INTRAMUSCULAR | Status: DC
  Administered 2022-08-08 – 2022-08-09 (×2): 100 mg via INTRAVENOUS
  Filled 2022-08-08 (×2): qty 2

## 2022-08-08 MED ORDER — NICOTINE 14 MG/24HR TD PT24
14.0000 mg | MEDICATED_PATCH | Freq: Every day | TRANSDERMAL | Status: DC
  Filled 2022-08-08 (×3): qty 1

## 2022-08-08 MED ORDER — ONDANSETRON HCL 4 MG/2ML IJ SOLN: 4.0000 mg | Freq: Four times a day (QID) | INTRAMUSCULAR | Status: DC | PRN

## 2022-08-08 MED ORDER — METOPROLOL TARTRATE 25 MG PO TABS
25.0000 mg | ORAL_TABLET | Freq: Two times a day (BID) | ORAL | Status: DC
  Administered 2022-08-08: 25 mg via ORAL
  Filled 2022-08-08: qty 1

## 2022-08-08 MED ORDER — SODIUM CHLORIDE 0.9% FLUSH
3.0000 mL | Freq: Two times a day (BID) | INTRAVENOUS | Status: DC
  Administered 2022-08-08 – 2022-08-09 (×3): 3 mL via INTRAVENOUS

## 2022-08-08 MED ORDER — ADULT MULTIVITAMIN W/MINERALS CH
1.0000 | ORAL_TABLET | Freq: Every day | ORAL | Status: DC
  Administered 2022-08-08 – 2022-08-10 (×3): 1 via ORAL
  Filled 2022-08-08 (×3): qty 1

## 2022-08-08 MED ORDER — FOLIC ACID 1 MG PO TABS
1.0000 mg | ORAL_TABLET | Freq: Every day | ORAL | Status: DC
  Administered 2022-08-08 – 2022-08-10 (×3): 1 mg via ORAL
  Filled 2022-08-08 (×3): qty 1

## 2022-08-08 MED ORDER — CARVEDILOL 3.125 MG PO TABS: 3.1250 mg | ORAL_TABLET | Freq: Two times a day (BID) | ORAL | Status: DC

## 2022-08-08 MED ORDER — ACETAMINOPHEN 325 MG PO TABS: 650.0000 mg | ORAL_TABLET | ORAL | Status: DC | PRN

## 2022-08-08 MED ORDER — ASPIRIN 81 MG PO TBEC
81.0000 mg | DELAYED_RELEASE_TABLET | Freq: Every day | ORAL | Status: DC
  Administered 2022-08-08 – 2022-08-10 (×3): 81 mg via ORAL
  Filled 2022-08-08 (×3): qty 1

## 2022-08-08 MED ORDER — IPRATROPIUM-ALBUTEROL 0.5-2.5 (3) MG/3ML IN SOLN: 3.0000 mL | Freq: Four times a day (QID) | RESPIRATORY_TRACT | Status: DC | PRN

## 2022-08-08 MED ORDER — POTASSIUM CHLORIDE CRYS ER 20 MEQ PO TBCR
20.0000 meq | EXTENDED_RELEASE_TABLET | Freq: Every day | ORAL | Status: DC
  Administered 2022-08-08 – 2022-08-09 (×2): 20 meq via ORAL
  Filled 2022-08-08 (×2): qty 1

## 2022-08-08 MED ORDER — LISINOPRIL 5 MG PO TABS
2.5000 mg | ORAL_TABLET | Freq: Every day | ORAL | Status: DC
  Administered 2022-08-08: 2.5 mg via ORAL
  Filled 2022-08-08: qty 1

## 2022-08-08 NOTE — ED Notes (Signed)
Patient repositioned to her right side.

## 2022-08-08 NOTE — ED Notes (Signed)
Lab called to obtain labs

## 2022-08-08 NOTE — Progress Notes (Signed)
  Brief Progress Note (See full H&P from earlier today)   Subjective: Pt reports no CP, reports mild SOB   Objective:  Relevant new results/findings:  Increased BP,   Physical Exam:  BP (!) 166/126   Pulse 92   Temp 98 F (36.7 C) (Oral)   Resp (!) 28   Ht 5\' 1"  (1.549 m)   Wt 113.4 kg   SpO2 99%   BMI 47.24 kg/m  Constitutional:  General Appearance: alert, well-developed, well-nourished, NAD Respiratory: Normal respiratory effort Breath sounds normal, no wheeze/rhonchi/rales Cardiovascular: S1/S2 normal, no murmur/rub/gallop auscultated No lower extremity edema Gastrointestinal: Nontender, no masses Musculoskeletal:  No clubbing/cyanosis of digits Neurological: No cranial nerve deficit on limited exam Psychiatric: Normal judgment/insight Normal mood and affect   Assessment/Plan changes or updates compared to H&P: No updates, will defer cardiology consult for now pending echo, continue iv lasix and starting po metoprolol

## 2022-08-08 NOTE — ED Notes (Signed)
Pt removed from Bipap, and placed on 4L nasal cannula

## 2022-08-08 NOTE — Consult Note (Signed)
   Heart Failure Nurse Navigator Note  HF-results of echocardiogram are pending at this time.  Presented to the emergency room with complaints of increasing shortness of breath, DOE and orthopnea.  BNP 277, BMI is 47.  Chest x-ray revealed mild vascular congestion.  Comorbidities:  Anxiety/depression Bipolar Smoker Alcohol use Morbid obesity    Medications:  Furosemide 40 mg IV x 1  Labs:  Sodium 138, potassium 3.5, chloride 104, CO2 27, BUN 13, creatinine 0.09, estimated GFR greater than 60. Documented 113.4 kg Output 800 mL  intake not documented.   Initial meeting with patient she is lying on a gurney in the emergency room in no acute distress.  She remains on O2 per nasal cannula.  There are no family members present at the bedside.  Discussed heart failure and what it means.  Went over the 2 main types of heart failure.  States that she has not heard that term in regards to herself in the past.  Went over the importance of daily weights, restriction of daily fluid intake and restriction of sodium.  Talked about removing the salt shaker from the table, using natural spices and staying away from processed foods, along with restaurant foods.  Patient states that she mostly cooks from scratch.  Still stressed reading labels.  She states that she does not have a scale at home, she is on disability and she has 2 teenage daughters of which 1 has a birthday tomorrow and she is wanting to get home for that.  Was made aware of her follow-up in the outpatient heart failure clinic on May 16 at 10 AM.  I also told her that I would supply her with a scale from the clinic.  Was given the living with heart failure teaching booklet, zone magnet, info on low-sodium and heart failure along with weight chart.  She had no further questions.  Tresa Endo RN [facility]

## 2022-08-08 NOTE — H&P (Addendum)
History and Physical   [facility] - [place] @ [facility] Admission History and Physical [employer], D.O.    Patient Name: Sabrina Mata MR#: [id number] Date of Birth: 08-02-1980 Date of Admission: 08/08/2022  Referring MD/NP/PA: Dr. Katrinka Blazing Primary Care Physician: Pcp, No  Chief Complaint:  Chief Complaint  Patient presents with   Shortness of Breath    HPI: Sabrina Mata is a 42 y.o. female with a known history of anxiety, depression, bipolar presents to the emergency department for evaluation of SOB.  Patient was in a usual state of health until one week ago when she developed SOB, DOE , orthopnea.   She denies any recent illnesses or infections, did have COVID 1 year ago but was asymptomatic.  No recent surgeries or pregnancies.  She does drink tequila 3-4 shots per night for the past 3 years or so and smokes 1/2 to 1 pack of cigarettes per day many years.  She is independent with activities of daily living.  She is on disability and does not work  Patient denies fevers/chills, weakness, dizziness, chest pain,  N/V/C/D, abdominal pain, dysuria/frequency, changes in mental status.    Otherwise there has been no change in status. Patient has been taking medication as prescribed and there has been no recent change in medication or diet.  No recent antibiotics.  There has been no recent illness, hospitalizations, travel or sick contacts.    EMS/ED Course: Patient received Lasix. Medical admission has been requested for further management of new onset congestive heart failure.  Review of Systems:  CONSTITUTIONAL: No fever/chills, fatigue, weakness, weight gain/loss, headache. EYES: No blurry or double vision. ENT: No tinnitus, postnasal drip, redness or soreness of the oropharynx. RESPIRATORY: Positive dyspnea on exertion and shortness of breath, positive orthopnea no cough, wheeze.  No hemoptysis.  CARDIOVASCULAR: No chest pain, palpitations, syncope,  orthopnea. No lower extremity edema.  GASTROINTESTINAL: No nausea, vomiting, abdominal pain, diarrhea, constipation.  No hematemesis, melena or hematochezia. GENITOURINARY: No dysuria, frequency, hematuria. ENDOCRINE: No polyuria or nocturia. No heat or cold intolerance. HEMATOLOGY: No anemia, bruising, bleeding. INTEGUMENTARY: No rashes, ulcers, lesions. MUSCULOSKELETAL: No arthritis, gout. NEUROLOGIC: No numbness, tingling, ataxia, seizure-type activity, weakness. PSYCHIATRIC: No anxiety, depression, insomnia.   Past Medical History:  Diagnosis Date   Anxiety    Bipolar 1 disorder, depressed (HCC)    Depression     Past Surgical History:  Procedure Laterality Date   CESAREAN SECTION     times 2   SHOULDER SURGERY Right      reports that she has been smoking cigarettes. She has a 10.00 pack-year smoking history. She has never used smokeless tobacco. She reports current alcohol use of about 2.0 standard drinks of alcohol per week. She reports that she does not currently use drugs.  No Known Allergies  Family History  Problem Relation Age of Onset   COPD Mother    Heart disease Mother    Drug abuse Mother    Sickle cell trait Brother     Prior to Admission medications   Not on File    Physical Exam: Vitals:   08/07/22 2207 08/07/22 2209 08/08/22 0130 08/08/22 0402  BP:  (!) 149/88 124/85 (!) 165/120  Pulse:  92 (!) 106 (!) 106  Resp:  (!) 22 (!) 24 (!) 36  Temp:  98 F (36.7 C)    TempSrc:  Oral    SpO2:  97% 99% 96%  Weight: 113.4 kg     Height: 5'  1" (1.549 m)       GENERAL: 42 y.o.-year-old female patient, well-developed, well-nourished lying in the bed in no acute distress.  BiPAP in place.  Normal respiratory effort, speaking in full sentences HEENT: Head atraumatic, normocephalic. Pupils equal. Mucus membranes moist. NECK: Supple. No JVD. CHEST: Normal breath sounds bilaterally. No wheezing, rales, rhonchi or crackles. No use of accessory muscles of  respiration.  No reproducible chest wall tenderness.  CARDIOVASCULAR: S1, S2 normal. No murmurs, rubs, or gallops. Cap refill <2 seconds. Pulses intact distally.  ABDOMEN: Soft, nondistended, nontender. No rebound, guarding, rigidity. Normoactive bowel sounds present in all four quadrants.  EXTREMITIES: No pedal edema, cyanosis, or clubbing. No calf tenderness or Homan's sign.  NEUROLOGIC: The patient is alert and oriented x 3. Cranial nerves II through XII are grossly intact with no focal sensorimotor deficit. PSYCHIATRIC:  Normal affect, mood, thought content. SKIN: Warm, dry, and intact without obvious rash, lesion, or ulcer.    Labs on Admission:  CBC: Recent Labs  Lab 08/07/22 2210  WBC 12.6*  NEUTROABS 8.0*  HGB 14.4  HCT 43.2  MCV 91.5  PLT 364   Basic Metabolic Panel: Recent Labs  Lab 08/07/22 2210  NA 138  K 3.5  CL 104  CO2 27  GLUCOSE 106*  BUN 13  CREATININE 1.09*  CALCIUM 8.6*   GFR: Estimated Creatinine Clearance: 79.3 mL/min (A) (by C-G formula based on SCr of 1.09 mg/dL (H)). Liver Function Tests: Recent Labs  Lab 08/07/22 2210  AST 34  ALT 37  ALKPHOS 79  BILITOT 0.8  PROT 7.5  ALBUMIN 3.7   No results for input(s): "LIPASE", "AMYLASE" in the last 168 hours. No results for input(s): "AMMONIA" in the last 168 hours. Coagulation Profile: No results for input(s): "INR", "PROTIME" in the last 168 hours. Cardiac Enzymes: No results for input(s): "CKTOTAL", "CKMB", "CKMBINDEX", "TROPONINI" in the last 168 hours. BNP (last 3 results) No results for input(s): "PROBNP" in the last 8760 hours. HbA1C: No results for input(s): "HGBA1C" in the last 72 hours. CBG: No results for input(s): "GLUCAP" in the last 168 hours. Lipid Profile: No results for input(s): "CHOL", "HDL", "LDLCALC", "TRIG", "CHOLHDL", "LDLDIRECT" in the last 72 hours. Thyroid Function Tests: No results for input(s): "TSH", "T4TOTAL", "FREET4", "T3FREE", "THYROIDAB" in the last 72  hours. Anemia Panel: No results for input(s): "VITAMINB12", "FOLATE", "FERRITIN", "TIBC", "IRON", "RETICCTPCT" in the last 72 hours. Urine analysis:    Component Value Date/Time   COLORURINE YELLOW 11/27/2020 0218   APPEARANCEUR CLOUDY (A) 11/27/2020 0218   LABSPEC >1.030 (H) 11/27/2020 0218   PHURINE 5.5 11/27/2020 0218   GLUCOSEU NEGATIVE 11/27/2020 0218   HGBUR LARGE (A) 11/27/2020 0218   BILIRUBINUR NEGATIVE 11/27/2020 0218   KETONESUR NEGATIVE 11/27/2020 0218   PROTEINUR NEGATIVE 11/27/2020 0218   NITRITE NEGATIVE 11/27/2020 0218   LEUKOCYTESUR NEGATIVE 11/27/2020 0218   Sepsis Labs: @LABRCNTIP (procalcitonin:4,lacticidven:4) )No results found for this or any previous visit (from the past 240 hour(s)).   Radiological Exams on Admission: CT Angio Chest PE W and/or Wo Contrast  Result Date: 08/08/2022 CLINICAL DATA:  Tachypnea EXAM: CT ANGIOGRAPHY CHEST WITH CONTRAST TECHNIQUE: Multidetector CT imaging of the chest was performed using the standard protocol during bolus administration of intravenous contrast. Multiplanar CT image reconstructions and MIPs were obtained to evaluate the vascular anatomy. RADIATION DOSE REDUCTION: This exam was performed according to the departmental dose-optimization program which includes automated exposure control, adjustment of the mA and/or kV according to patient size and/or  use of iterative reconstruction technique. CONTRAST:  OMNIPAQUE IOHEXOL 350 MG/ML SOLN COMPARISON:  Chest x-ray 08/07/2022 FINDINGS: Cardiovascular: Satisfactory opacification of the pulmonary arteries to the segmental level. No evidence of pulmonary embolism. Cardiomegaly. No pericardial effusion. Nonaneurysmal aorta. Mediastinum/Nodes: Midline trachea. Subcentimeter mediastinal lymph nodes. Esophagus within normal limits Lungs/Pleura: Trace bilateral pleural effusions. Diffuse bilateral septal thickening. Subpleural indistinct nodularity. Scattered bilateral pulmonary  parenchymal nodules, for example 4 mm subpleural lingular nodule, series 6, image 60. Sub pleural nodularity on the right measures up to 8 mm for example series 6, image 87 Upper Abdomen: No acute finding Musculoskeletal: No chest wall abnormality. No acute or significant osseous findings. Review of the MIP images confirms the above findings. IMPRESSION: 1. Negative for acute pulmonary embolus. 2. Cardiomegaly with trace bilateral pleural effusions and diffuse septal thickening. Indistinct slightly nodular foci of airspace disease at the periphery of the lung, suspect that the findings are largely related to pulmonary edema. There are however scattered bilateral pulmonary nodules, largest measuring up to 8 mm. Non-contrast chest CT at 3-6 months is recommended. If the nodules are stable at time of repeat CT, then future CT at 18-24 months (from today's scan) is considered optional for low-risk patients, but is recommended for high-risk patients. This recommendation follows the consensus statement: Guidelines for Management of Incidental Pulmonary Nodules Detected on CT Images: From the Fleischner Society 2017; Radiology 2017; 284:228-243. Electronically Signed   By: Jasmine Pang M.D.   On: 08/08/2022 03:20   DG Chest Portable 1 View  Result Date: 08/07/2022 CLINICAL DATA:  Dyspnea for several days EXAM: PORTABLE CHEST 1 VIEW COMPARISON:  09/21/2014 FINDINGS: Cardiac shadow is mildly prominent. Lungs are well aerated bilaterally with mild vascular congestion. No focal infiltrate or effusion is seen. No bony abnormality is noted. IMPRESSION: Mild vascular congestion.  No edema is noted. Electronically Signed   By: Alcide Clever M.D.   On: 08/07/2022 22:45    EKG: Sinus tach at 119 bpm with rightward axis and nonspecific ST-T wave changes.   Assessment/Plan  This is a 42 y.o. female with a history of anxiety, depression, bipolar now being admitted with:  #. New onset of congestive heart failure -Admit  telemetry inpatient stepdown - Beta blocker, ACE, diuretic - Intake/output, daily weight. - Trend troponins, check lipids and TSH, urine drug screen, alcohol level - Echo -Request cardiology consult in a.m.  #.  Alcohol use disorder -cessation advised - Oral vitamins  #.  Nicotine dependence - Cessation advised - Nicotine patch  #.  Incidental pulmonary nodules will need follow-up imaging  Admission status: Telemetry obs IV Fluids: Hep-Lock Diet/Nutrition: Heart healthy Consults called: Cardiology DVT Px: SCDs and early ambulation. Code Status: Full Code  Disposition Plan: To home in 1-2 days  All the records are reviewed and case discussed with ED provider. Management plans discussed with the patient and/or family who express understanding and agree with plan of care.  Eyoel Throgmorton D.O. on 08/08/2022 at 4:28 AM CC: Primary care physician; Pcp, No   08/08/2022, 4:28 AM

## 2022-08-08 NOTE — ED Provider Notes (Signed)
[facility] Provider Note    Event Date/Time   First MD Initiated Contact with Patient 08/08/22 0126     (approximate)   History   Shortness of Breath   HPI  Sabrina Mata is a 42 y.o. female who presents to the ED for evaluation of Shortness of Breath   No relevant history in the chart.  Patient reports that she is a cigarette smoker but denies any other medical history or prescription medications.  Reports a week of increasing dyspnea, fear of sleeping due to waking up gasping for breath.   Physical Exam   Triage Vital Signs: ED Triage Vitals  Enc Vitals Group     BP 08/07/22 2209 (!) 149/88     Pulse Rate 08/07/22 2209 92     Resp 08/07/22 2209 (!) 22     Temp 08/07/22 2209 98 F (36.7 C)     Temp Source 08/07/22 2209 Oral     SpO2 08/07/22 2209 97 %     Weight 08/07/22 2207 250 lb (113.4 kg)     Height 08/07/22 2207 5\' 1"  (1.549 m)     Head Circumference --      Peak Flow --      Pain Score 08/07/22 2207 0     Pain Loc --      Pain Edu? --      Excl. in GC? --     Most recent vital signs: Vitals:   08/07/22 2209 08/08/22 0130  BP: (!) 149/88 124/85  Pulse: 92 (!) 106  Resp: (!) 22 (!) 24  Temp: 98 F (36.7 C)   SpO2: 97% 99%    General: Awake.  Obese.  On my initial evaluation, standing, tachycardic, tachypneic and appears anxious.  Prefers to stand up than be in the stretcher CV:  Good peripheral perfusion.  Tachycardic and regular Resp:  Respiratory rate around 30, no wheezing.  Borderline sats 90-92% on room air Abd:  No distention.  Soft MSK:  No deformity noted.  Neuro:  No focal deficits appreciated. Other:     ED Results / Procedures / Treatments   Labs (all labs ordered are listed, but only abnormal results are displayed) Labs Reviewed  CBC WITH DIFFERENTIAL/PLATELET - Abnormal; Notable for the following components:      Result Value   WBC 12.6 (*)    Neutro Abs 8.0 (*)    All other components  within normal limits  COMPREHENSIVE METABOLIC PANEL - Abnormal; Notable for the following components:   Glucose, Bld 106 (*)    Creatinine, Ser 1.09 (*)    Calcium 8.6 (*)    All other components within normal limits  BRAIN NATRIURETIC PEPTIDE - Abnormal; Notable for the following components:   B Natriuretic Peptide 277.6 (*)    All other components within normal limits  TROPONIN I (HIGH SENSITIVITY)  TROPONIN I (HIGH SENSITIVITY)    EKG Sinus tachycardia rate of 119 bpm.  Rightward axis and normal intervals.  Nonspecific ST changes without STEMI.  RADIOLOGY 1 view CXR interpreted by me with pulm vascular congestion and slight cardiomegaly.  No clear infiltration or PTX CTA chest interpreted by me without evidence of acute PE  Official radiology report(s): CT Angio Chest PE W and/or Wo Contrast  Result Date: 08/08/2022 CLINICAL DATA:  Tachypnea EXAM: CT ANGIOGRAPHY CHEST WITH CONTRAST TECHNIQUE: Multidetector CT imaging of the chest was performed using the standard protocol during bolus administration of intravenous contrast. Multiplanar CT  image reconstructions and MIPs were obtained to evaluate the vascular anatomy. RADIATION DOSE REDUCTION: This exam was performed according to the departmental dose-optimization program which includes automated exposure control, adjustment of the mA and/or kV according to patient size and/or use of iterative reconstruction technique. CONTRAST:  OMNIPAQUE IOHEXOL 350 MG/ML SOLN COMPARISON:  Chest x-ray 08/07/2022 FINDINGS: Cardiovascular: Satisfactory opacification of the pulmonary arteries to the segmental level. No evidence of pulmonary embolism. Cardiomegaly. No pericardial effusion. Nonaneurysmal aorta. Mediastinum/Nodes: Midline trachea. Subcentimeter mediastinal lymph nodes. Esophagus within normal limits Lungs/Pleura: Trace bilateral pleural effusions. Diffuse bilateral septal thickening. Subpleural indistinct nodularity. Scattered bilateral  pulmonary parenchymal nodules, for example 4 mm subpleural lingular nodule, series 6, image 60. Sub pleural nodularity on the right measures up to 8 mm for example series 6, image 87 Upper Abdomen: No acute finding Musculoskeletal: No chest wall abnormality. No acute or significant osseous findings. Review of the MIP images confirms the above findings. IMPRESSION: 1. Negative for acute pulmonary embolus. 2. Cardiomegaly with trace bilateral pleural effusions and diffuse septal thickening. Indistinct slightly nodular foci of airspace disease at the periphery of the lung, suspect that the findings are largely related to pulmonary edema. There are however scattered bilateral pulmonary nodules, largest measuring up to 8 mm. Non-contrast chest CT at 3-6 months is recommended. If the nodules are stable at time of repeat CT, then future CT at 18-24 months (from today's scan) is considered optional for low-risk patients, but is recommended for high-risk patients. This recommendation follows the consensus statement: Guidelines for Management of Incidental Pulmonary Nodules Detected on CT Images: From the Fleischner Society 2017; Radiology 2017; 284:228-243. Electronically Signed   By: Jasmine Pang M.D.   On: 08/08/2022 03:20   DG Chest Portable 1 View  Result Date: 08/07/2022 CLINICAL DATA:  Dyspnea for several days EXAM: PORTABLE CHEST 1 VIEW COMPARISON:  09/21/2014 FINDINGS: Cardiac shadow is mildly prominent. Lungs are well aerated bilaterally with mild vascular congestion. No focal infiltrate or effusion is seen. No bony abnormality is noted. IMPRESSION: Mild vascular congestion.  No edema is noted. Electronically Signed   By: Alcide Clever M.D.   On: 08/07/2022 22:45    PROCEDURES and INTERVENTIONS:  .1-3 Lead EKG Interpretation  Performed by: Delton Prairie, MD Authorized by: Delton Prairie, MD     Interpretation: abnormal     ECG rate:  110   ECG rate assessment: tachycardic     Rhythm: sinus tachycardia      Ectopy: none     Conduction: normal   .Critical Care  Performed by: Delton Prairie, MD Authorized by: Delton Prairie, MD   Critical care provider statement:    Critical care time (minutes):  30   Critical care time was exclusive of:  Separately billable procedures and treating other patients   Critical care was necessary to treat or prevent imminent or life-threatening deterioration of the following conditions:  Respiratory failure   Critical care was time spent personally by me on the following activities:  Development of treatment plan with patient or surrogate, discussions with consultants, evaluation of patient's response to treatment, examination of patient, ordering and review of laboratory studies, ordering and review of radiographic studies, ordering and performing treatments and interventions, pulse oximetry, re-evaluation of patient's condition and review of old charts   Medications  furosemide (LASIX) injection 40 mg (has no administration in time range)  iohexol (OMNIPAQUE) 350 MG/ML injection 100 mL (100 mLs Intravenous Contrast Given 08/08/22 0258)     IMPRESSION /  MDM / ASSESSMENT AND PLAN / ED COURSE  I reviewed the triage vital signs and the nursing notes.  Differential diagnosis includes, but is not limited to, ACS, PE, CHF, wheezing, pneumothorax  {Patient presents with symptoms of an acute illness or injury that is potentially life-threatening.  42 year old female presents to the ED with evidence of new onset CHF requiring BiPAP and medical admission to initiate diuresis.  When I see her prior to BiPAP, she is restless, tachycardic, tachypneic with borderline saturations on room air, drastically improving with BiPAP.  CXR is congested and BNP is elevated without comparison.  Troponins are negative and EKG without clear STEMI.  Marginal leukocytosis is noted but no clear infectious etiology of her symptoms.  Metabolic panel with normal electrolytes.  We will start diuresis  and consult medicine for admission  Clinical Course as of 08/08/22 0424  Wed Aug 08, 2022  0340 Reassessed, respirations are slowed on BiPAP and she is resting comfortably.  She reports feeling much better and is appreciative.  We discussed CHF, reassuring CT and recommendations for admission.  She is agreeable. [DS]  1610 I consult with hospitalist who agrees to admit [DS]    Clinical Course User Index [DS] Delton Prairie, MD     FINAL CLINICAL IMPRESSION(S) / ED DIAGNOSES   Final diagnoses:  New onset of congestive heart failure (HCC)  Shortness of breath     Rx / DC Orders   ED Discharge Orders     None        Note:  This document was prepared using Dragon voice recognition software and may include unintentional dictation errors.   Delton Prairie, MD 08/08/22 0400

## 2022-08-08 NOTE — ED Notes (Signed)
Pt's family members at bedside. 

## 2022-08-08 NOTE — Discharge Instructions (Signed)

## 2022-08-08 NOTE — Hospital Course (Addendum)
Sabrina Mata is a 42 y.o. female with a known history of anxiety, depression, bipolar presents to the emergency department for evaluation of SOB.  Patient was in a usual state of health until one week ago when she developed SOB, DOE, orthopnea.  04/29: arrived to ED  05/01: CXR mild vascular congestion, CTA chest no PE, (+)Cardiomegaly w/ septal thickening, scattered pulmonary nodules, trace pleural effusions. Was admitted to hospitalist service for new CHF. SOB prompted placement BiPap overnight but no VBG/ABG obtained, pt feeling improved and transitioned to 4L [place] this morning --> RA this afternoon. Echo EF 30-35%, LV global hypokinesis and mild dilation, G2DD. Cardiology recs - continue Lasix bid, carvedilol, losartan, Jardiance; consider outpatient cardiac CTA; needs outpatient sleep study.  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  CAD Diuresing Add ASA and statin  continue Lasix bid Starting carvedilol, losartan, Jardiance consider outpatient cardiac CTA --> follow w/ [facility] outpatient  needs outpatient sleep study   Acute hypoxic respiratory failure d/t CHF, resolved O2 supplementation as needed  Hyperlipidemia  Starting statin   Alcohol use disorder cessation advised Oral vitamins   Nicotine dependence Cessation advised Nicotine patch   Incidental pulmonary nodules  will need follow-up imaging outpatient     DVT prophylaxis: lovenox  Pertinent IV fluids/nutrition: no IV fluids, cardiac diet  Central lines / invasive devices: none  Code Status: FULL  CODE ACP documentation reviewed: 08/08/22, none on file   Current Admission Status: inpatient  TOC needs / Dispo plan: none at this time Barriers to discharge / significant pending items: cardiology consult

## 2022-08-09 ENCOUNTER — Inpatient Hospital Stay (HOSPITAL_COMMUNITY)
Admit: 2022-08-09 | Discharge: 2022-08-09 | Disposition: A | Discharge status: Home / Self Care | Payer: Medicare Other | Attending: Family Medicine | Admitting: Family Medicine

## 2022-08-09 ENCOUNTER — Encounter: Payer: Self-pay | Admitting: Osteopathic Medicine

## 2022-08-09 DIAGNOSIS — I509 Heart failure, unspecified: Secondary | ICD-10-CM | POA: Diagnosis not present

## 2022-08-09 DIAGNOSIS — R0602 Shortness of breath: Secondary | ICD-10-CM | POA: Diagnosis not present

## 2022-08-09 DIAGNOSIS — E782 Mixed hyperlipidemia: Secondary | ICD-10-CM

## 2022-08-09 DIAGNOSIS — F419 Anxiety disorder, unspecified: Secondary | ICD-10-CM | POA: Diagnosis not present

## 2022-08-09 DIAGNOSIS — F1010 Alcohol abuse, uncomplicated: Secondary | ICD-10-CM

## 2022-08-09 DIAGNOSIS — E785 Hyperlipidemia, unspecified: Secondary | ICD-10-CM | POA: Insufficient documentation

## 2022-08-09 DIAGNOSIS — I420 Dilated cardiomyopathy: Secondary | ICD-10-CM

## 2022-08-09 DIAGNOSIS — I5021 Acute systolic (congestive) heart failure: Secondary | ICD-10-CM

## 2022-08-09 DIAGNOSIS — F17200 Nicotine dependence, unspecified, uncomplicated: Secondary | ICD-10-CM

## 2022-08-09 LAB — URINE DRUG SCREEN, QUALITATIVE (ARMC ONLY)
Amphetamines, Ur Screen: NOT DETECTED
Barbiturates, Ur Screen: NOT DETECTED
Benzodiazepine, Ur Scrn: NOT DETECTED
Cannabinoid 50 Ng, Ur ~~LOC~~: POSITIVE — AB
Cocaine Metabolite,Ur ~~LOC~~: NOT DETECTED
MDMA (Ecstasy)Ur Screen: NOT DETECTED
Methadone Scn, Ur: NOT DETECTED
Opiate, Ur Screen: NOT DETECTED
Phencyclidine (PCP) Ur S: NOT DETECTED
Tricyclic, Ur Screen: NOT DETECTED

## 2022-08-09 LAB — BASIC METABOLIC PANEL
Anion gap: 8 (ref 5–15)
BUN: 13 mg/dL (ref 6–20)
CO2: 27 mmol/L (ref 22–32)
Calcium: 8.8 mg/dL — ABNORMAL LOW (ref 8.9–10.3)
Chloride: 104 mmol/L (ref 98–111)
Creatinine, Ser: 0.89 mg/dL (ref 0.44–1.00)
GFR, Estimated: >60 mL/min (ref >60)
Glucose, Bld: 106 mg/dL — ABNORMAL HIGH (ref 70–99)
Potassium: 3.5 mmol/L (ref 3.5–5.1)
Sodium: 139 mmol/L (ref 135–145)

## 2022-08-09 LAB — ECHOCARDIOGRAM COMPLETE
AR max vel: 2.36 cm2
AV Area VTI: 2.67 cm2
AV Area mean vel: 2.26 cm2
AV Mean grad: 5 mmHg
AV Peak grad: 9.7 mmHg
Ao pk vel: 1.56 m/s
Area-P 1/2: 4.65 cm2
Calc EF: 33.2 %
MV VTI: 2.58 cm2
Radius: 0.45 cm
S' Lateral: 3.8 cm
Single Plane A2C EF: 35.8 %
Single Plane A4C EF: 27.4 %

## 2022-08-09 LAB — TROPONIN I (HIGH SENSITIVITY): Troponin I (High Sensitivity): 13 ng/L (ref <18)

## 2022-08-09 MED ORDER — POTASSIUM CHLORIDE CRYS ER 20 MEQ PO TBCR
40.0000 meq | EXTENDED_RELEASE_TABLET | Freq: Every day | ORAL | Status: DC
  Administered 2022-08-09 – 2022-08-10 (×2): 40 meq via ORAL
  Filled 2022-08-09 (×2): qty 2

## 2022-08-09 MED ORDER — THIAMINE MONONITRATE 100 MG PO TABS
100.0000 mg | ORAL_TABLET | Freq: Every day | ORAL | Status: DC
  Administered 2022-08-10: 100 mg via ORAL
  Filled 2022-08-09: qty 1

## 2022-08-09 MED ORDER — EMPAGLIFLOZIN 10 MG PO TABS
10.0000 mg | ORAL_TABLET | Freq: Every day | ORAL | Status: DC
  Administered 2022-08-09 – 2022-08-10 (×2): 10 mg via ORAL
  Filled 2022-08-09 (×2): qty 1

## 2022-08-09 MED ORDER — LOSARTAN POTASSIUM 25 MG PO TABS
12.5000 mg | ORAL_TABLET | Freq: Every day | ORAL | Status: DC
  Administered 2022-08-09 – 2022-08-10 (×2): 12.5 mg via ORAL
  Filled 2022-08-09 (×2): qty 1

## 2022-08-09 MED ORDER — CARVEDILOL 3.125 MG PO TABS
3.1250 mg | ORAL_TABLET | Freq: Two times a day (BID) | ORAL | Status: DC
  Administered 2022-08-09 – 2022-08-10 (×2): 3.125 mg via ORAL
  Filled 2022-08-09 (×2): qty 1

## 2022-08-09 MED ORDER — ENOXAPARIN SODIUM 40 MG/0.4ML IJ SOSY
40.0000 mg | PREFILLED_SYRINGE | INTRAMUSCULAR | Status: DC
  Administered 2022-08-09: 40 mg via SUBCUTANEOUS
  Filled 2022-08-09: qty 0.4

## 2022-08-09 MED ORDER — FUROSEMIDE 10 MG/ML IJ SOLN
40.0000 mg | Freq: Two times a day (BID) | INTRAMUSCULAR | Status: DC
  Administered 2022-08-09 – 2022-08-10 (×2): 40 mg via INTRAVENOUS
  Filled 2022-08-09 (×2): qty 4

## 2022-08-09 MED ORDER — SACUBITRIL-VALSARTAN 24-26 MG PO TABS: 1.0000 | ORAL_TABLET | Freq: Two times a day (BID) | ORAL | Status: DC

## 2022-08-09 MED ORDER — PERFLUTREN LIPID MICROSPHERE
1.0000 mL | INTRAVENOUS | Status: AC | PRN
  Administered 2022-08-09: 5 mL via INTRAVENOUS

## 2022-08-09 NOTE — Consult Note (Signed)
Cardiology Consultation   Patient ID: Sabrina Mata MRN: [id number]; DOB: 04-26-1980  Admit date: 08/08/2022 Date of Consult: 08/09/2022  PCP:  Oneita Hurt No   [facility] Providers Cardiologist:  None      New consult done by Dr Mariah Milling  Patient Profile:   Sabrina Mata is a 42 y.o. female with a hx of anxiety, depression, bipolar disorder, current smoker, previous history of COVID-19 infection, who is being seen 08/09/2022 for the evaluation of shortness of breath with newly found HFrEF at the request of Dr. Lyn Hollingshead.  History of Present Illness:   Sabrina Mata is a 42 year old female with previously mentioned past medical history.  She presented to the [facility] emergency department on 08/08/2022 with complaints of progressive worsening of shortness of breath over the last week.  She had stated she was in her usual state of health until she developed the shortness of breath dyspnea on exertion and orthopnea.  She denies any sick contacts but notes that she was diagnosed with COVID-19 infection approximately a year ago but only had nasal symptoms.  It was an incidental finding on a previous emergency department visit and she was considered out of the window for antivirals at that time.  Unfortunately she does have a longstanding history of smoking and alcohol use.  There was concern for new onset CHF requiring BiPAP and IV diuresis.  Prior to being placed on BiPAP she was restless, tachycardic, tachypneic with borderline saturations on room air, however drastically improved while on BiPAP.  She was started on diuresis and medicine was consulted for admission.  Initial vital signs: Blood pressure 149/88, pulse of 92, respirate 22, temperature 98  Pertinent labs: WBCs of 12.6, blood glucose 106, serum creatinine 1.09, calcium 8.6, BNP 277.6, high-sensitivity troponins have trended negative x 4, COVID-19 swab negative, urine pregnancy was negative, alcohol <10  Imaging:  Chest x-ray revealed mild vascular congestion with no edema; chest CT negative for acute pulmonary embolism, cardiomegaly with trace bilateral pleural effusions and diffuse septal thickening, pulmonary edema, scattered bilateral pulmonary nodules largest measuring up to 8 mm  Medications administered in the emergency department: Furosemide 40 mg IVP  Cardiology was consulted today for new onset HFrEF  Past Medical History:  Diagnosis Date   Anxiety    Bipolar 1 disorder, depressed (HCC)    Depression     Past Surgical History:  Procedure Laterality Date   CESAREAN SECTION     times 2   SHOULDER SURGERY Right      Home Medications:  Prior to Admission medications   Not on File    Inpatient Medications: Scheduled Meds:  aspirin EC  81 mg Oral Daily   folic acid  1 mg Oral Daily   furosemide  40 mg Intravenous Daily   multivitamin with minerals  1 tablet Oral Daily   nicotine  14 mg Transdermal Daily   potassium chloride  20 mEq Oral Daily   sodium chloride flush  3 mL Intravenous Q12H   thiamine (VITAMIN B1) injection  100 mg Intravenous Daily   Continuous Infusions:  sodium chloride     PRN Meds: sodium chloride, acetaminophen, ipratropium-albuterol, ondansetron (ZOFRAN) IV, sodium chloride flush  Allergies:   No Known Allergies  Social History:   Social History   Socioeconomic History   Marital status: Single    Spouse name: NA   Number of children: 2   Years of education: 11   Highest education level: GED or  equivalent  Occupational History   Occupation: unemployed   Occupation: SSI   Tobacco Use   Smoking status: Every Day    Packs/day: 0.50    Years: 20.00    Additional pack years: 0.00    Total pack years: 10.00    Types: Cigarettes   Smokeless tobacco: Never   Tobacco comments:    denies 2nd smoke  Vaping Use   Vaping Use: Never used  Substance and Sexual Activity   Alcohol use: Yes    Alcohol/week: 2.0 standard drinks of alcohol    Types: 2  Shots of liquor per week    Comment: patient drinks 1-3x per week    Drug use: Not Currently   Sexual activity: Not on file  Other Topics Concern   Not on file  Social History Narrative   Patient is a single mom of two children ages 52 and 45yo. She receives disability due to her mental health diagnoses. She has one close friend and has a supportive friendship with the father of her children. She lost 4 loved ones due to Covid-19 from 04/2019 - 06/2019, including her grandmother, her mother, her uncle and her mother's boyfriend. She also recently suffered a miscarriage.    Social Determinants of Health   Financial Resource Strain: Not on file  Food Insecurity: No Food Insecurity (08/08/2022)   Hunger Vital Sign    Worried About Running Out of Food in the Last Year: Never true    Ran Out of Food in the Last Year: Never true  Transportation Needs: No Transportation Needs (08/08/2022)   PRAPARE - Administrator, Civil Service (Medical): No    Lack of Transportation (Non-Medical): No  Physical Activity: Not on file  Stress: Not on file  Social Connections: Not on file  Intimate Partner Violence: Not At Risk (08/08/2022)   Humiliation, Afraid, Rape, and Kick questionnaire    Fear of Current or Ex-Partner: No    Emotionally Abused: No    Physically Abused: No    Sexually Abused: No    Family History:    Family History  Problem Relation Age of Onset   COPD Mother    Heart disease Mother    Drug abuse Mother    Sickle cell trait Brother      ROS:  Please see the history of present illness.  Review of Systems  Constitutional:  Positive for malaise/fatigue.  Respiratory:  Positive for shortness of breath.   Cardiovascular:  Positive for orthopnea and leg swelling.  Psychiatric/Behavioral:  Positive for substance abuse.     All other ROS reviewed and negative.     Physical Exam/Data:   Vitals:   08/09/22 1010 08/09/22 1300 08/09/22 1358 08/09/22 1420  BP:  125/87 109/80    Pulse:  85 94   Resp:  (!) 32 16   Temp: 98.4 F (36.9 C)  98.4 F (36.9 C)   TempSrc: Oral  Oral   SpO2:  95% 100%   Weight:    115.5 kg  Height:        Intake/Output Summary (Last 24 hours) at 08/09/2022 1525 Last data filed at 08/09/2022 1400 Gross per 24 hour  Intake 480 ml  Output --  Net 480 ml      08/09/2022    2:20 PM 08/07/2022   10:07 PM 05/25/2021   10:00 AM  Last 3 Weights  Weight (lbs) 254 lb 9.6 oz 250 lb 249 lb 1.9 oz  Weight (kg) 115.486  kg 113.399 kg 113 kg     Body mass index is 48.11 kg/m.  General:  Well nourished, well developed, in no acute distress HEENT: normal Neck: no JVD Vascular: No carotid bruits; Distal pulses 2+ bilaterally Cardiac:  normal S1, S2; RRR; no murmur  Lungs:  clear to auscultation bilaterally, no wheezing, rhonchi or rales, respirations are unlabored on room air Abd: soft, nontender, obese, no hepatomegaly  Ext: no edema Musculoskeletal:  No deformities, BUE and BLE strength normal and equal Skin: warm and dry  Neuro:  CNs 2-12 intact, no focal abnormalities noted Psych:  Normal affect   EKG:  The EKG was personally reviewed and demonstrates: Sinus tachycardia with a rate of 119 and a rightward axis which could not rule out anterior infarct Telemetry:  Telemetry was personally reviewed and demonstrates:  sinus rates 80's  Relevant CV Studies: 2D echo 08/09/22 1. Left ventricular ejection fraction, by estimation, is 30 to 35%. Left  ventricular ejection fraction by 2D MOD biplane is 33.2 %. The left  ventricle has moderately decreased function. The left ventricle  demonstrates global hypokinesis. The left  ventricular internal cavity size was mildly dilated. Left ventricular  diastolic parameters are consistent with Grade II diastolic dysfunction  (pseudonormalization).   2. Right ventricular systolic function is normal. The right ventricular  size is normal. Tricuspid regurgitation signal is inadequate for assessing  PA  pressure.   3. The mitral valve is normal in structure. Mild to moderate mitral valve  regurgitation. No evidence of mitral stenosis.   4. The aortic valve has an indeterminant number of cusps. Aortic valve  regurgitation is not visualized. No aortic stenosis is present.   5. The inferior vena cava is normal in size with greater than 50%  respiratory variability, suggesting right atrial pressure of 3 mmHg.   Laboratory Data:  High Sensitivity Troponin:   Recent Labs  Lab 08/07/22 2210 08/08/22 0131 08/08/22 1751 08/09/22 0621  TROPONINIHS 12 15 14 13      Chemistry Recent Labs  Lab 08/07/22 2210 08/09/22 0621  NA 138 139  K 3.5 3.5  CL 104 104  CO2 27 27  GLUCOSE 106* 106*  BUN 13 13  CREATININE 1.09* 0.89  CALCIUM 8.6* 8.8*  GFRNONAA >60 >60  ANIONGAP 7 8    Recent Labs  Lab 08/07/22 2210  PROT 7.5  ALBUMIN 3.7  AST 34  ALT 37  ALKPHOS 79  BILITOT 0.8   Lipids  Recent Labs  Lab 08/08/22 1751  CHOL 236*  TRIG 126  HDL 33*  LDLCALC 178*  CHOLHDL 7.2    Hematology Recent Labs  Lab 08/07/22 2210  WBC 12.6*  RBC 4.72  HGB 14.4  HCT 43.2  MCV 91.5  MCH 30.5  MCHC 33.3  RDW 14.0  PLT 364   Thyroid  Recent Labs  Lab 08/08/22 1751  TSH 2.099    BNP Recent Labs  Lab 08/08/22 0203  BNP 277.6*    DDimer No results for input(s): "DDIMER" in the last 168 hours.   Radiology/Studies:  ECHOCARDIOGRAM COMPLETE  Result Date: 08/09/2022    ECHOCARDIOGRAM REPORT   Patient Name:   Sabrina Mata Date of Exam: 08/09/2022 Medical Rec #:  [id number]                Height:       61.0 in Accession #:    [id number]  Weight:       250.0 lb Date of Birth:  Jul 02, 1980                 BSA:          2.077 m Patient Age:    41 years                 BP:           113/46 mmHg Patient Gender: F                        HR:           89 bpm. Exam Location:  [facility] Procedure: 2D Echo, Cardiac Doppler, Color Doppler and Intracardiac             Opacification Agent Indications:     CHF  History:         Patient has no prior history of Echocardiogram examinations.                  CHF; Risk Factors:Current Smoker.  Sonographer:     Mikki Harbor Referring Phys:  [id number] ALEXIS HUGELMEYER Diagnosing Phys: Julien Nordmann MD  Sonographer Comments: Technically difficult study due to poor echo windows and patient is obese. IMPRESSIONS  1. Left ventricular ejection fraction, by estimation, is 30 to 35%. Left ventricular ejection fraction by 2D MOD biplane is 33.2 %. The left ventricle has moderately decreased function. The left ventricle demonstrates global hypokinesis. The left ventricular internal cavity size was mildly dilated. Left ventricular diastolic parameters are consistent with Grade II diastolic dysfunction (pseudonormalization).  2. Right ventricular systolic function is normal. The right ventricular size is normal. Tricuspid regurgitation signal is inadequate for assessing PA pressure.  3. The mitral valve is normal in structure. Mild to moderate mitral valve regurgitation. No evidence of mitral stenosis.  4. The aortic valve has an indeterminant number of cusps. Aortic valve regurgitation is not visualized. No aortic stenosis is present.  5. The inferior vena cava is normal in size with greater than 50% respiratory variability, suggesting right atrial pressure of 3 mmHg. FINDINGS  Left Ventricle: Left ventricular ejection fraction, by estimation, is 30 to 35%. Left ventricular ejection fraction by 2D MOD biplane is 33.2 %. The left ventricle has moderately decreased function. The left ventricle demonstrates global hypokinesis. Definity contrast agent was given IV to delineate the left ventricular endocardial borders. The left ventricular internal cavity size was mildly dilated. There is no left ventricular hypertrophy. Left ventricular diastolic parameters are consistent with Grade II diastolic dysfunction (pseudonormalization). Right Ventricle:  The right ventricular size is normal. No increase in right ventricular wall thickness. Right ventricular systolic function is normal. Tricuspid regurgitation signal is inadequate for assessing PA pressure. Left Atrium: Left atrial size was normal in size. Right Atrium: Right atrial size was normal in size. Pericardium: There is no evidence of pericardial effusion. Mitral Valve: The mitral valve is normal in structure. Mild to moderate mitral valve regurgitation. No evidence of mitral valve stenosis. MV peak gradient, 9.0 mmHg. The mean mitral valve gradient is 3.0 mmHg. Tricuspid Valve: The tricuspid valve is normal in structure. Tricuspid valve regurgitation is not demonstrated. No evidence of tricuspid stenosis. Aortic Valve: The aortic valve has an indeterminant number of cusps. Aortic valve regurgitation is not visualized. No aortic stenosis is present. Aortic valve mean gradient measures 5.0 mmHg. Aortic valve peak gradient measures 9.7 mmHg. Aortic valve area, by VTI measures  2.67 cm. Pulmonic Valve: The pulmonic valve was normal in structure. Pulmonic valve regurgitation is mild. No evidence of pulmonic stenosis. Aorta: The aortic root is normal in size and structure. Venous: The inferior vena cava is normal in size with greater than 50% respiratory variability, suggesting right atrial pressure of 3 mmHg. IAS/Shunts: No atrial level shunt detected by color flow Doppler.  LEFT VENTRICLE PLAX 2D                        Biplane EF (MOD) LVIDd:         5.30 cm         LV Biplane EF:   Left LVIDs:         3.80 cm                          ventricular LV PW:         1.20 cm                          ejection LV IVS:        1.20 cm                          fraction by LVOT diam:     1.90 cm                          2D MOD LV SV:         72                               biplane is LV SV Index:   35                               33.2 %. LVOT Area:     2.84 cm                                Diastology                                 LV e' medial:    7.18 cm/s LV Volumes (MOD)               LV E/e' medial:  16.4 LV vol d, MOD    118.0 ml      LV e' lateral:   6.53 cm/s A2C:                           LV E/e' lateral: 18.1 LV vol d, MOD    133.0 ml A4C: LV vol s, MOD    75.8 ml A2C: LV vol s, MOD    96.6 ml A4C: LV SV MOD A2C:   42.2 ml LV SV MOD A4C:   133.0 ml LV SV MOD BP:    42.8 ml RIGHT VENTRICLE RV Basal diam:  2.90 cm RV Mid diam:    2.60 cm RV S prime:     14.40 cm/s TAPSE (M-mode): 2.8 cm LEFT ATRIUM  Index        RIGHT ATRIUM           Index LA diam:        3.40 cm 1.64 cm/m   RA Area:     14.70 cm LA Vol (A2C):   53.1 ml 25.57 ml/m  RA Volume:   33.60 ml  16.18 ml/m LA Vol (A4C):   48.1 ml 23.16 ml/m LA Biplane Vol: 50.7 ml 24.41 ml/m  AORTIC VALVE                    PULMONIC VALVE AV Area (Vmax):    2.36 cm     PV Vmax:       0.86 m/s AV Area (Vmean):   2.26 cm     PV Peak grad:  3.0 mmHg AV Area (VTI):     2.67 cm AV Vmax:           156.00 cm/s AV Vmean:          98.400 cm/s AV VTI:            0.269 m AV Peak Grad:      9.7 mmHg AV Mean Grad:      5.0 mmHg LVOT Vmax:         130.00 cm/s LVOT Vmean:        78.500 cm/s LVOT VTI:          0.253 m LVOT/AV VTI ratio: 0.94  AORTA Ao Root diam: 3.50 cm MITRAL VALVE MV Area (PHT): 4.65 cm     SHUNTS MV Area VTI:   2.58 cm     Systemic VTI:  0.25 m MV Peak grad:  9.0 mmHg     Systemic Diam: 1.90 cm MV Mean grad:  3.0 mmHg MV Vmax:       1.50 m/s MV Vmean:      86.5 cm/s MV Decel Time: 163 msec MR PISA:        1.27 cm MR PISA Radius: 0.45 cm MV E velocity: 118.00 cm/s MV A velocity: 84.90 cm/s MV E/A ratio:  1.39 Julien Nordmann MD Electronically signed by Julien Nordmann MD Signature Date/Time: 08/09/2022/12:27:34 PM    Final    CT Angio Chest PE W and/or Wo Contrast  Result Date: 08/08/2022 CLINICAL DATA:  Tachypnea EXAM: CT ANGIOGRAPHY CHEST WITH CONTRAST TECHNIQUE: Multidetector CT imaging of the chest was performed using the standard protocol during bolus  administration of intravenous contrast. Multiplanar CT image reconstructions and MIPs were obtained to evaluate the vascular anatomy. RADIATION DOSE REDUCTION: This exam was performed according to the departmental dose-optimization program which includes automated exposure control, adjustment of the mA and/or kV according to patient size and/or use of iterative reconstruction technique. CONTRAST:  OMNIPAQUE IOHEXOL 350 MG/ML SOLN COMPARISON:  Chest x-ray 08/07/2022 FINDINGS: Cardiovascular: Satisfactory opacification of the pulmonary arteries to the segmental level. No evidence of pulmonary embolism. Cardiomegaly. No pericardial effusion. Nonaneurysmal aorta. Mediastinum/Nodes: Midline trachea. Subcentimeter mediastinal lymph nodes. Esophagus within normal limits Lungs/Pleura: Trace bilateral pleural effusions. Diffuse bilateral septal thickening. Subpleural indistinct nodularity. Scattered bilateral pulmonary parenchymal nodules, for example 4 mm subpleural lingular nodule, series 6, image 60. Sub pleural nodularity on the right measures up to 8 mm for example series 6, image 87 Upper Abdomen: No acute finding Musculoskeletal: No chest wall abnormality. No acute or significant osseous findings. Review of the MIP images confirms the above findings. IMPRESSION: 1. Negative for acute pulmonary embolus. 2. Cardiomegaly with trace  bilateral pleural effusions and diffuse septal thickening. Indistinct slightly nodular foci of airspace disease at the periphery of the lung, suspect that the findings are largely related to pulmonary edema. There are however scattered bilateral pulmonary nodules, largest measuring up to 8 mm. Non-contrast chest CT at 3-6 months is recommended. If the nodules are stable at time of repeat CT, then future CT at 18-24 months (from today's scan) is considered optional for low-risk patients, but is recommended for high-risk patients. This recommendation follows the consensus statement:  Guidelines for Management of Incidental Pulmonary Nodules Detected on CT Images: From the Fleischner Society 2017; Radiology 2017; 284:228-243. Electronically Signed   By: Jasmine Pang M.D.   On: 08/08/2022 03:20   DG Chest Portable 1 View  Result Date: 08/07/2022 CLINICAL DATA:  Dyspnea for several days EXAM: PORTABLE CHEST 1 VIEW COMPARISON:  09/21/2014 FINDINGS: Cardiac shadow is mildly prominent. Lungs are well aerated bilaterally with mild vascular congestion. No focal infiltrate or effusion is seen. No bony abnormality is noted. IMPRESSION: Mild vascular congestion.  No edema is noted. Electronically Signed   By: Alcide Clever M.D.   On: 08/07/2022 22:45     Assessment and Plan:   New onset HFrEF -Patient arrived with complaints of shortness of breath, dyspnea on exertion, orthopnea -BNP 277.6 -Echocardiogram revealed LVEF 30-35%, global hypokinesis, G2 DD, mild to moderate mitral regurgitation -Continue to be weaned off of oxygen therapy -Has been receiving furosemide 40 mg IV daily will likely need to transition to oral diuretic therapy on discharge -Initiate GDMT and escalate as tolerated by blood pressure and kidney function -started coreg 3.125 mg bid and losartan 12.5 mg daily and jardiance 10 mg daily -Continue with heart failure education -Daily weights, I's and O's, low-sodium diet -Will benefit from ischemic workup to determine ischemic versus nonischemic cause, can consider coronary CTA in outpatient setting  Elevated blood pressure without the diagnosis of hypertension -Blood pressure on arrival 149/88 -Blood pressure this morning was 161/109.  Current blood pressure 109/80 -Currently only on furosemide 40 mg IV daily -Vital signs per unit protocol  Polysubstance abuse with alcohol and tobacco -Continues to drink tequila shots daily, which can lower EF -Currently on Nicoderm CQ patch for smoking  -10 pack year smoking history -Total cessation is recommended for  both  Morbid obesity -BMI 48.11 -Encouraged to increase activity and decrease caloric intake -Weight loss is recommended  Risk Assessment/Risk Scores:        New York Heart Association (NYHA) Functional Class NYHA Class II        For questions or updates, please contact [facility] Please consult [email or web address] for contact info under    Signed, Heath Tesler, NP  08/09/2022 3:25 PM

## 2022-08-09 NOTE — Progress Notes (Signed)
PROGRESS NOTE    Sabrina Mata   WUJ:[id number] DOB: 17-Feb-1981  DOA: 08/08/2022 Date of Service: 08/09/22 PCP: Oneita Hurt, No     Brief Narrative / Hospital Course:  Sabrina Mata is a 42 y.o. female with a known history of anxiety, depression, bipolar presents to the emergency department for evaluation of SOB.  Patient was in a usual state of health until one week ago when she developed SOB, DOE, orthopnea.  04/29: arrived to ED  05/01: CXR mild vascular congestion, CTA chest no PE, (+)Cardiomegaly w/ septal thickening, scattered pulmonary nodules, trace pleural effusions. Was admitted to hospitalist service for new CHF. SOB prompted placement BiPap overnight but no VBG/ABG obtained, pt feeling improved and transitioned to 4L [place] this morning --> RA this afternoon. Echo EF 30-35%, LV global hypokinesis and mild dilation, G2DD. Cardiology to see pt  Consultants:  Cardiology   Procedures: none      ASSESSMENT & PLAN:   Principal Problem:   CHF (congestive heart failure) systolic EF 30-35 and diastolic grade 2 diastolic dysfunction Active Problems:   Morbid obesity (HCC) 210 lbs; BMI=42.2   Anxiety dx'd 2004   New onset of congestive heart failure (HCC)   Hyperlipidemia  New onset of congestive heart failure systolic and diastolic Diuresing Add ASA and statin  Soft BP precludes addition of GDMT at this time, defer to cardiology  Cardiology consult Strict I&O   Acute hypoxic respiratory failure d/t CHF, resolved O2 supplementation as needed  Hyperlipidemia  Starting statin   Alcohol use disorder cessation advised Oral vitamins   Nicotine dependence Cessation advised Nicotine patch   Incidental pulmonary nodules  will need follow-up imaging outpatient     DVT prophylaxis: lovenox  Pertinent IV fluids/nutrition: no IV fluids, cardiac diet  Central lines / invasive devices: none  Code Status: FULL CODE ACP documentation reviewed: 08/08/22, none  on file   Current Admission Status: inpatient  TOC needs / Dispo plan: none at this time Barriers to discharge / significant pending items: cardiology consult              Subjective / Brief ROS:  Patient reports SOB is better, ambulating ok w/o O2 Denies CP Denies new weakness.  Tolerating diet .  Reports no concerns w/ urination/defecation.   Family Communication: none at this time     Objective Findings:  Vitals:   08/09/22 1010 08/09/22 1300 08/09/22 1358 08/09/22 1420  BP:  125/87 109/80   Pulse:  85 94   Resp:  (!) 32 16   Temp: 98.4 F (36.9 C)  98.4 F (36.9 C)   TempSrc: Oral  Oral   SpO2:  95% 100%   Weight:    115.5 kg  Height:        Intake/Output Summary (Last 24 hours) at 08/09/2022 1526 Last data filed at 08/09/2022 1400 Gross per 24 hour  Intake 480 ml  Output --  Net 480 ml   Filed Weights   08/07/22 2207 08/09/22 1420  Weight: 113.4 kg 115.5 kg    Examination:  Physical Exam Constitutional:      General: She is not in acute distress. Cardiovascular:     Rate and Rhythm: Normal rate and regular rhythm.  Pulmonary:     Effort: Pulmonary effort is normal.     Breath sounds: Normal breath sounds.  Musculoskeletal:     Right lower leg: Edema (trace) present.     Left lower leg: Edema (trace) present.  Neurological:  General: No focal deficit present.     Mental Status: She is alert and oriented to person, place, and time.  Psychiatric:        Mood and Affect: Mood normal.        Behavior: Behavior normal.          Scheduled Medications:   aspirin EC  81 mg Oral Daily   enoxaparin (LOVENOX) injection  40 mg Subcutaneous Q24H   folic acid  1 mg Oral Daily   furosemide  40 mg Intravenous Daily   multivitamin with minerals  1 tablet Oral Daily   nicotine  14 mg Transdermal Daily   potassium chloride  20 mEq Oral Daily   sodium chloride flush  3 mL Intravenous Q12H   thiamine (VITAMIN B1) injection  100 mg Intravenous  Daily    Continuous Infusions:  sodium chloride      PRN Medications:  sodium chloride, acetaminophen, ipratropium-albuterol, ondansetron (ZOFRAN) IV, sodium chloride flush  Antimicrobials from admission:  Anti-infectives (From admission, onward)    None           Data Reviewed:  I have personally reviewed the following...  CBC: Recent Labs  Lab 08/07/22 2210  WBC 12.6*  NEUTROABS 8.0*  HGB 14.4  HCT 43.2  MCV 91.5  PLT 364   Basic Metabolic Panel: Recent Labs  Lab 08/07/22 2210 08/09/22 0621  NA 138 139  K 3.5 3.5  CL 104 104  CO2 27 27  GLUCOSE 106* 106*  BUN 13 13  CREATININE 1.09* 0.89  CALCIUM 8.6* 8.8*   GFR: Estimated Creatinine Clearance: 98.4 mL/min (by C-G formula based on SCr of 0.89 mg/dL). Liver Function Tests: Recent Labs  Lab 08/07/22 2210  AST 34  ALT 37  ALKPHOS 79  BILITOT 0.8  PROT 7.5  ALBUMIN 3.7   No results for input(s): "LIPASE", "AMYLASE" in the last 168 hours. No results for input(s): "AMMONIA" in the last 168 hours. Coagulation Profile: No results for input(s): "INR", "PROTIME" in the last 168 hours. Cardiac Enzymes: No results for input(s): "CKTOTAL", "CKMB", "CKMBINDEX", "TROPONINI" in the last 168 hours. BNP (last 3 results) No results for input(s): "PROBNP" in the last 8760 hours. HbA1C: No results for input(s): "HGBA1C" in the last 72 hours. CBG: No results for input(s): "GLUCAP" in the last 168 hours. Lipid Profile: Recent Labs    08/08/22 1751  CHOL 236*  HDL 33*  LDLCALC 178*  TRIG 126  CHOLHDL 7.2   Thyroid Function Tests: Recent Labs    08/08/22 1751  TSH 2.099   Anemia Panel: No results for input(s): "VITAMINB12", "FOLATE", "FERRITIN", "TIBC", "IRON", "RETICCTPCT" in the last 72 hours. Most Recent Urinalysis On File:     Component Value Date/Time   COLORURINE YELLOW 11/27/2020 0218   APPEARANCEUR CLOUDY (A) 11/27/2020 0218   LABSPEC >1.030 (H) 11/27/2020 0218   PHURINE 5.5  11/27/2020 0218   GLUCOSEU NEGATIVE 11/27/2020 0218   HGBUR LARGE (A) 11/27/2020 0218   BILIRUBINUR NEGATIVE 11/27/2020 0218   KETONESUR NEGATIVE 11/27/2020 0218   PROTEINUR NEGATIVE 11/27/2020 0218   NITRITE NEGATIVE 11/27/2020 0218   LEUKOCYTESUR NEGATIVE 11/27/2020 0218   Sepsis Labs: @LABRCNTIP (procalcitonin:4,lacticidven:4) Microbiology: Recent Results (from the past 240 hour(s))  SARS Coronavirus 2 by RT PCR (hospital order, performed in [facility] hospital lab) *cepheid single result test* Anterior Nasal Swab     Status: None   Collection Time: 08/08/22  4:38 AM   Specimen: Anterior Nasal Swab  Result Value Ref  Range Status   SARS Coronavirus 2 by RT PCR NEGATIVE NEGATIVE Final    Comment: (NOTE) SARS-CoV-2 target nucleic acids are NOT DETECTED.  The SARS-CoV-2 RNA is generally detectable in upper and lower respiratory specimens during the acute phase of infection. The lowest concentration of SARS-CoV-2 viral copies this assay can detect is 250 copies / mL. A negative result does not preclude SARS-CoV-2 infection and should not be used as the sole basis for treatment or other patient management decisions.  A negative result may occur with improper specimen collection / handling, submission of specimen other than nasopharyngeal swab, presence of viral mutation(s) within the areas targeted by this assay, and inadequate number of viral copies (<250 copies / mL). A negative result must be combined with clinical observations, patient history, and epidemiological information.  Fact Sheet for Patients:   RoadLapTop.co.za  Fact Sheet for Healthcare Providers: http://kim-miller.com/  This test is not yet approved or  cleared by the [place] FDA and has been authorized for detection and/or diagnosis of SARS-CoV-2 by FDA under an Emergency Use Authorization (EUA).  This EUA will remain in effect (meaning this test can be used)  for the duration of the COVID-19 declaration under Section 564(b)(1) of the Act, 21 U.S.C. section 360bbb-3(b)(1), unless the authorization is terminated or revoked sooner.  Performed at [facility], [address] [place], [place] [address]       Radiology Studies last 3 days: ECHOCARDIOGRAM COMPLETE  Result Date: 08/09/2022    ECHOCARDIOGRAM REPORT   Patient Name:   RYKER PHERIGO LONG Date of Exam: 08/09/2022 Medical Rec #:  [id number]                Height:       61.0 in Accession #:    [id number]               Weight:       250.0 lb Date of Birth:  1980-06-06                 BSA:          2.077 m Patient Age:    41 years                 BP:           113/46 mmHg Patient Gender: F                        HR:           89 bpm. Exam Location:  [facility] Procedure: 2D Echo, Cardiac Doppler, Color Doppler and Intracardiac            Opacification Agent Indications:     CHF  History:         Patient has no prior history of Echocardiogram examinations.                  CHF; Risk Factors:Current Smoker.  Sonographer:     Mikki Harbor Referring Phys:  [id number] ALEXIS HUGELMEYER Diagnosing Phys: Julien Nordmann MD  Sonographer Comments: Technically difficult study due to poor echo windows and patient is obese. IMPRESSIONS  1. Left ventricular ejection fraction, by estimation, is 30 to 35%. Left ventricular ejection fraction by 2D MOD biplane is 33.2 %. The left ventricle has moderately decreased function. The left ventricle demonstrates global hypokinesis. The left ventricular internal cavity size was mildly dilated. Left ventricular diastolic parameters are consistent with Grade II  diastolic dysfunction (pseudonormalization).  2. Right ventricular systolic function is normal. The right ventricular size is normal. Tricuspid regurgitation signal is inadequate for assessing PA pressure.  3. The mitral valve is normal in structure. Mild to moderate mitral valve regurgitation. No evidence of mitral  stenosis.  4. The aortic valve has an indeterminant number of cusps. Aortic valve regurgitation is not visualized. No aortic stenosis is present.  5. The inferior vena cava is normal in size with greater than 50% respiratory variability, suggesting right atrial pressure of 3 mmHg. FINDINGS  Left Ventricle: Left ventricular ejection fraction, by estimation, is 30 to 35%. Left ventricular ejection fraction by 2D MOD biplane is 33.2 %. The left ventricle has moderately decreased function. The left ventricle demonstrates global hypokinesis. Definity contrast agent was given IV to delineate the left ventricular endocardial borders. The left ventricular internal cavity size was mildly dilated. There is no left ventricular hypertrophy. Left ventricular diastolic parameters are consistent with Grade II diastolic dysfunction (pseudonormalization). Right Ventricle: The right ventricular size is normal. No increase in right ventricular wall thickness. Right ventricular systolic function is normal. Tricuspid regurgitation signal is inadequate for assessing PA pressure. Left Atrium: Left atrial size was normal in size. Right Atrium: Right atrial size was normal in size. Pericardium: There is no evidence of pericardial effusion. Mitral Valve: The mitral valve is normal in structure. Mild to moderate mitral valve regurgitation. No evidence of mitral valve stenosis. MV peak gradient, 9.0 mmHg. The mean mitral valve gradient is 3.0 mmHg. Tricuspid Valve: The tricuspid valve is normal in structure. Tricuspid valve regurgitation is not demonstrated. No evidence of tricuspid stenosis. Aortic Valve: The aortic valve has an indeterminant number of cusps. Aortic valve regurgitation is not visualized. No aortic stenosis is present. Aortic valve mean gradient measures 5.0 mmHg. Aortic valve peak gradient measures 9.7 mmHg. Aortic valve area, by VTI measures 2.67 cm. Pulmonic Valve: The pulmonic valve was normal in structure. Pulmonic valve  regurgitation is mild. No evidence of pulmonic stenosis. Aorta: The aortic root is normal in size and structure. Venous: The inferior vena cava is normal in size with greater than 50% respiratory variability, suggesting right atrial pressure of 3 mmHg. IAS/Shunts: No atrial level shunt detected by color flow Doppler.  LEFT VENTRICLE PLAX 2D                        Biplane EF (MOD) LVIDd:         5.30 cm         LV Biplane EF:   Left LVIDs:         3.80 cm                          ventricular LV PW:         1.20 cm                          ejection LV IVS:        1.20 cm                          fraction by LVOT diam:     1.90 cm                          2D MOD LV SV:  72                               biplane is LV SV Index:   35                               33.2 %. LVOT Area:     2.84 cm                                Diastology                                LV e' medial:    7.18 cm/s LV Volumes (MOD)               LV E/e' medial:  16.4 LV vol d, MOD    118.0 ml      LV e' lateral:   6.53 cm/s A2C:                           LV E/e' lateral: 18.1 LV vol d, MOD    133.0 ml A4C: LV vol s, MOD    75.8 ml A2C: LV vol s, MOD    96.6 ml A4C: LV SV MOD A2C:   42.2 ml LV SV MOD A4C:   133.0 ml LV SV MOD BP:    42.8 ml RIGHT VENTRICLE RV Basal diam:  2.90 cm RV Mid diam:    2.60 cm RV S prime:     14.40 cm/s TAPSE (M-mode): 2.8 cm LEFT ATRIUM             Index        RIGHT ATRIUM           Index LA diam:        3.40 cm 1.64 cm/m   RA Area:     14.70 cm LA Vol (A2C):   53.1 ml 25.57 ml/m  RA Volume:   33.60 ml  16.18 ml/m LA Vol (A4C):   48.1 ml 23.16 ml/m LA Biplane Vol: 50.7 ml 24.41 ml/m  AORTIC VALVE                    PULMONIC VALVE AV Area (Vmax):    2.36 cm     PV Vmax:       0.86 m/s AV Area (Vmean):   2.26 cm     PV Peak grad:  3.0 mmHg AV Area (VTI):     2.67 cm AV Vmax:           156.00 cm/s AV Vmean:          98.400 cm/s AV VTI:            0.269 m AV Peak Grad:      9.7 mmHg AV Mean Grad:      5.0  mmHg LVOT Vmax:         130.00 cm/s LVOT Vmean:        78.500 cm/s LVOT VTI:          0.253 m LVOT/AV VTI ratio: 0.94  AORTA Ao Root diam: 3.50 cm MITRAL VALVE MV Area (PHT): 4.65 cm     SHUNTS MV Area VTI:  2.58 cm     Systemic VTI:  0.25 m MV Peak grad:  9.0 mmHg     Systemic Diam: 1.90 cm MV Mean grad:  3.0 mmHg MV Vmax:       1.50 m/s MV Vmean:      86.5 cm/s MV Decel Time: 163 msec MR PISA:        1.27 cm MR PISA Radius: 0.45 cm MV E velocity: 118.00 cm/s MV A velocity: 84.90 cm/s MV E/A ratio:  1.39 Julien Nordmann MD Electronically signed by Julien Nordmann MD Signature Date/Time: 08/09/2022/12:27:34 PM    Final    CT Angio Chest PE W and/or Wo Contrast  Result Date: 08/08/2022 CLINICAL DATA:  Tachypnea EXAM: CT ANGIOGRAPHY CHEST WITH CONTRAST TECHNIQUE: Multidetector CT imaging of the chest was performed using the standard protocol during bolus administration of intravenous contrast. Multiplanar CT image reconstructions and MIPs were obtained to evaluate the vascular anatomy. RADIATION DOSE REDUCTION: This exam was performed according to the departmental dose-optimization program which includes automated exposure control, adjustment of the mA and/or kV according to patient size and/or use of iterative reconstruction technique. CONTRAST:  OMNIPAQUE IOHEXOL 350 MG/ML SOLN COMPARISON:  Chest x-ray 08/07/2022 FINDINGS: Cardiovascular: Satisfactory opacification of the pulmonary arteries to the segmental level. No evidence of pulmonary embolism. Cardiomegaly. No pericardial effusion. Nonaneurysmal aorta. Mediastinum/Nodes: Midline trachea. Subcentimeter mediastinal lymph nodes. Esophagus within normal limits Lungs/Pleura: Trace bilateral pleural effusions. Diffuse bilateral septal thickening. Subpleural indistinct nodularity. Scattered bilateral pulmonary parenchymal nodules, for example 4 mm subpleural lingular nodule, series 6, image 60. Sub pleural nodularity on the right measures up to 8 mm for  example series 6, image 87 Upper Abdomen: No acute finding Musculoskeletal: No chest wall abnormality. No acute or significant osseous findings. Review of the MIP images confirms the above findings. IMPRESSION: 1. Negative for acute pulmonary embolus. 2. Cardiomegaly with trace bilateral pleural effusions and diffuse septal thickening. Indistinct slightly nodular foci of airspace disease at the periphery of the lung, suspect that the findings are largely related to pulmonary edema. There are however scattered bilateral pulmonary nodules, largest measuring up to 8 mm. Non-contrast chest CT at 3-6 months is recommended. If the nodules are stable at time of repeat CT, then future CT at 18-24 months (from today's scan) is considered optional for low-risk patients, but is recommended for high-risk patients. This recommendation follows the consensus statement: Guidelines for Management of Incidental Pulmonary Nodules Detected on CT Images: From the Fleischner Society 2017; Radiology 2017; 284:228-243. Electronically Signed   By: Jasmine Pang M.D.   On: 08/08/2022 03:20   DG Chest Portable 1 View  Result Date: 08/07/2022 CLINICAL DATA:  Dyspnea for several days EXAM: PORTABLE CHEST 1 VIEW COMPARISON:  09/21/2014 FINDINGS: Cardiac shadow is mildly prominent. Lungs are well aerated bilaterally with mild vascular congestion. No focal infiltrate or effusion is seen. No bony abnormality is noted. IMPRESSION: Mild vascular congestion.  No edema is noted. Electronically Signed   By: Alcide Clever M.D.   On: 08/07/2022 22:45             LOS: 1 day      Sunnie Nielsen, DO [facility] 08/09/2022, 3:26 PM    Dictation software may have been used to generate the above note. Typos may occur and escape review in typed/dictated notes. Please contact Dr Lyn Hollingshead directly for clarity if needed.  Staff may message me via secure chat in Epic  but this may not receive an immediate response,  please page  me for urgent matters!  If 7PM-7AM, please contact night coverage [email or web address]

## 2022-08-09 NOTE — Progress Notes (Signed)
*  PRELIMINARY RESULTS* Echocardiogram 2D Echocardiogram has been performed.  Sabrina Mata 08/09/2022, 10:21 AM

## 2022-08-10 ENCOUNTER — Telehealth (HOSPITAL_COMMUNITY): Payer: Self-pay | Admitting: Pharmacy Technician

## 2022-08-10 ENCOUNTER — Other Ambulatory Visit (HOSPITAL_COMMUNITY): Payer: Self-pay

## 2022-08-10 DIAGNOSIS — I5021 Acute systolic (congestive) heart failure: Secondary | ICD-10-CM | POA: Diagnosis not present

## 2022-08-10 DIAGNOSIS — E782 Mixed hyperlipidemia: Secondary | ICD-10-CM | POA: Diagnosis not present

## 2022-08-10 DIAGNOSIS — I509 Heart failure, unspecified: Secondary | ICD-10-CM | POA: Diagnosis not present

## 2022-08-10 DIAGNOSIS — I5041 Acute combined systolic (congestive) and diastolic (congestive) heart failure: Secondary | ICD-10-CM

## 2022-08-10 DIAGNOSIS — F1010 Alcohol abuse, uncomplicated: Secondary | ICD-10-CM | POA: Diagnosis not present

## 2022-08-10 LAB — BASIC METABOLIC PANEL
Anion gap: 8 (ref 5–15)
BUN: 16 mg/dL (ref 6–20)
CO2: 28 mmol/L (ref 22–32)
Calcium: 8.9 mg/dL (ref 8.9–10.3)
Chloride: 103 mmol/L (ref 98–111)
Creatinine, Ser: 1.05 mg/dL — ABNORMAL HIGH (ref 0.44–1.00)
GFR, Estimated: >60 mL/min (ref >60)
Glucose, Bld: 87 mg/dL (ref 70–99)
Potassium: 4 mmol/L (ref 3.5–5.1)
Sodium: 139 mmol/L (ref 135–145)

## 2022-08-10 MED ORDER — VITAMIN B-1 100 MG PO TABS: 100.0000 mg | ORAL_TABLET | Freq: Every day | ORAL | 0 refills | Status: AC

## 2022-08-10 MED ORDER — EMPAGLIFLOZIN 10 MG PO TABS: 10.0000 mg | ORAL_TABLET | Freq: Every day | ORAL | 0 refills | Status: DC

## 2022-08-10 MED ORDER — ASPIRIN 81 MG PO TBEC: 81.0000 mg | DELAYED_RELEASE_TABLET | Freq: Every day | ORAL | 0 refills | Status: DC

## 2022-08-10 MED ORDER — FUROSEMIDE 40 MG PO TABS: 40.0000 mg | ORAL_TABLET | Freq: Every day | ORAL | 0 refills | Status: DC

## 2022-08-10 MED ORDER — CARVEDILOL 3.125 MG PO TABS: 3.1250 mg | ORAL_TABLET | Freq: Once | ORAL | Status: DC

## 2022-08-10 MED ORDER — LOSARTAN POTASSIUM 25 MG PO TABS: 25.0000 mg | ORAL_TABLET | Freq: Every day | ORAL | 0 refills | Status: DC

## 2022-08-10 MED ORDER — FOLIC ACID 1 MG PO TABS: 1.0000 mg | ORAL_TABLET | Freq: Every day | ORAL | 0 refills | Status: DC

## 2022-08-10 MED ORDER — CARVEDILOL 6.25 MG PO TABS: 6.2500 mg | ORAL_TABLET | Freq: Two times a day (BID) | ORAL | Status: DC

## 2022-08-10 MED ORDER — ADULT MULTIVITAMIN W/MINERALS CH: 1.0000 | ORAL_TABLET | Freq: Every day | ORAL | Status: AC

## 2022-08-10 MED ORDER — CARVEDILOL 6.25 MG PO TABS: 6.2500 mg | ORAL_TABLET | Freq: Two times a day (BID) | ORAL | 0 refills | Status: DC

## 2022-08-10 MED ORDER — POTASSIUM CHLORIDE CRYS ER 20 MEQ PO TBCR: 20.0000 meq | EXTENDED_RELEASE_TABLET | Freq: Every day | ORAL | 0 refills | Status: DC

## 2022-08-10 MED ORDER — LOSARTAN POTASSIUM 25 MG PO TABS: 25.0000 mg | ORAL_TABLET | Freq: Every day | ORAL | Status: DC

## 2022-08-10 NOTE — TOC Benefit Eligibility Note (Signed)
Patient Product/process development scientist completed.    The patient is currently admitted and upon discharge could be taking Entresto 24-26 mg.  The current 30 day co-pay is $11.20.   The patient is currently admitted and upon discharge could be taking Jardiance 10 mg.  The current 30 day co-pay is $11.20.   The patient is insured through [employer]   This test claim was processed through [facility]- copay amounts may vary at other pharmacies due to pharmacy/plan contracts, or as the patient moves through the different stages of their insurance plan.  Roland Earl, CPHT Pharmacy Patient Advocate Specialist [facility] Patient Advocate Team Direct Number: [phone]  Fax: [phone]

## 2022-08-10 NOTE — Discharge Summary (Signed)
Physician Discharge Summary   Patient: Sabrina Mata MRN: [id number]  DOB: 1981-01-24   Admit:     Date of Admission: 08/08/2022 Admitted from: over   Discharge: Date of discharge: 08/10/22 Disposition: Home Condition at discharge: good  CODE STATUS: FULL CODE     Discharge Physician: Sunnie Nielsen, DO [facility]     PCP: Pcp, No  Recommendations for Outpatient Follow-up:  Follow up with PCP Pcp, No in 1-2 weeks Please obtain labs/tests: CBC, BMP in 1-2 weeks Keep appt w/ cardiology 05/16 Please follow up on the following pending results: none PCP AND OTHER OUTPATIENT PROVIDERS: SEE BELOW FOR SPECIFIC DISCHARGE INSTRUCTIONS PRINTED FOR PATIENT IN ADDITION TO GENERIC AVS PATIENT INFO     Discharge Instructions     (HEART FAILURE PATIENTS) Call MD:  Anytime you have any of the following symptoms: 1) 3 pound weight gain in 24 hours or 5 pounds in 1 week 2) shortness of breath, with or without a dry hacking cough 3) swelling in the hands, feet or stomach 4) if you have to sleep on extra pillows at night in order to breathe.   Complete by: As directed    AMB Referral to Phase II Cardiac Rehab   Complete by: As directed    Diagnosis: Heart Failure (see criteria below if ordering Phase II)   Heart Failure Type: Chronic Systolic & Diastolic   After initial evaluation and assessments completed: Virtual Based Care may be provided alone or in conjunction with Phase 2 Cardiac Rehab based on patient barriers.: Yes   Intensive Cardiac Rehabilitation (ICR) [facility] location only OR Traditional Cardiac Rehabilitation (TCR) *If criteria for ICR are not met will enroll in TCR [facility] only): Yes   AMB referral to CHF clinic   Complete by: As directed    Diet - low sodium heart healthy   Complete by: As directed    Increase activity slowly   Complete by: As directed          Discharge Diagnoses: Principal Problem:   CHF (congestive heart failure) systolic EF 30-35  and diastolic grade 2 diastolic dysfunction Active Problems:   Morbid obesity (HCC) 210 lbs; BMI=42.2   Anxiety dx'd 2004   New onset of congestive heart failure (HCC)   Hyperlipidemia   Shortness of breath   Acute systolic CHF (congestive heart failure) (HCC)   Dilated cardiomyopathy (HCC)   Alcohol abuse       Hospital Course: Sabrina Mata is a 42 y.o. female with a known history of anxiety, depression, bipolar presents to the emergency department for evaluation of SOB.  Patient was in a usual state of health until one week ago when she developed SOB, DOE, orthopnea.  04/29: arrived to ED  05/01: CXR mild vascular congestion, CTA chest no PE, (+)Cardiomegaly w/ septal thickening, scattered pulmonary nodules, trace pleural effusions. Was admitted to hospitalist service for new CHF. SOB prompted placement BiPap overnight but no VBG/ABG obtained, pt feeling improved and transitioned to 4L [place] this morning --> RA this afternoon. Echo done 05/02: Echo EF 30-35%, LV global hypokinesis and mild dilation, G2DD. Cardiology recs - continue Lasix bid, carvedilol, losartan, Jardiance; consider outpatient cardiac CTA; needs outpatient sleep study.  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  CAD Diuresing Add ASA and statin  continue Lasix bid Starting carvedilol, losartan, Jardiance consider outpatient cardiac CTA --> [facility] w/ [facility] outpatient  needs outpatient sleep study   Acute hypoxic respiratory failure d/t CHF, resolved O2  supplementation as needed  Hyperlipidemia  Starting statin   Alcohol use disorder cessation advised Oral vitamins   Nicotine dependence Cessation advised Nicotine patch   Incidental pulmonary nodules  will need follow-up imaging outpatient            Discharge Instructions  Allergies as of 08/10/2022   No Known Allergies      Medication List     TAKE these medications    aspirin EC 81 MG tablet Take 1 tablet (81 mg total) by mouth daily. Swallow whole. Start taking on: Aug 11, 2022   carvedilol 6.25 MG tablet Commonly known as: COREG Take 1 tablet (6.25 mg total) by mouth 2 (two) times daily with a meal.   empagliflozin 10 MG Tabs tablet Commonly known as: JARDIANCE Take 1 tablet (10 mg total) by mouth daily. Start taking on: Aug 11, 2022   folic acid 1 MG tablet Commonly known as: FOLVITE Take 1 tablet (1 mg total) by mouth daily. Start taking on: Aug 11, 2022   furosemide 40 MG tablet Commonly known as: Lasix Take 1 tablet (40 mg total) by mouth daily. Increase to 1 tablet (40 mg total) by mouth TWICE daily (total daily dose 80 mg) as needed for increased leg swelling, shortness of breath, weight gain 5+ lbs over 1-2 days. Seek medical care if these symptoms are not improving with increased dose.   losartan 25 MG tablet Commonly known as: COZAAR Take 1 tablet (25 mg total) by mouth daily. Start taking on: Aug 11, 2022   multivitamin with minerals Tabs tablet Take 1 tablet by mouth daily. Start taking on: Aug 11, 2022   potassium chloride SA 20 MEQ tablet Commonly known as: KLOR-CON M Take 1 tablet (20 mEq total) by mouth daily. Start taking on: Aug 11, 2022   thiamine 100 MG tablet Commonly known as: Vitamin B-1 Take 1 tablet (100 mg total) by mouth daily. Start taking on: Aug 11, 2022         Follow-up Information     Delma Freeze, FNP. Go in 13 day(s).   Specialty: Family Medicine Why: Appointment: May 16th, 2024 at 10:00am Contact  information: [address] [place] [address] [phone]                 No Known Allergies   Subjective: pt feeling better today, no SOB, ambulating well    Discharge Exam: BP (!) 131/97 (BP Location: Right Arm)   Pulse (!) 108   Temp (!) 97.4 F (36.3 C) (Oral)   Resp 18   Ht 5\' 1"  (1.549 m)   Wt 106.7 kg   SpO2 100%   BMI 44.46 kg/m  General: Pt is alert, awake, not in acute distress Cardiovascular: RRR, S1/S2 +, no rubs, no gallops Respiratory: CTA bilaterally, no wheezing, no rhonchi Abdominal: Soft, NT, ND, bowel sounds + Extremities: no edema, no cyanosis     The results of significant diagnostics from this hospitalization (including imaging, microbiology, ancillary  and laboratory) are listed below for reference.     Microbiology: Recent Results (from the past 240 hour(s))  SARS Coronavirus 2 by RT PCR (hospital order, performed in [facility] hospital lab) *cepheid single result test* Anterior Nasal Swab     Status: None   Collection Time: 08/08/22  4:38 AM   Specimen: Anterior Nasal Swab  Result Value Ref Range Status   SARS Coronavirus 2 by RT PCR NEGATIVE NEGATIVE Final    Comment: (NOTE) SARS-CoV-2 target nucleic acids are NOT DETECTED.  The SARS-CoV-2 RNA is generally detectable in upper and lower respiratory specimens during the acute phase of infection. The lowest concentration of SARS-CoV-2 viral copies this assay can detect is 250 copies / mL. A negative result does not preclude SARS-CoV-2 infection and should not be used as the sole basis for treatment or other patient management decisions.  A negative result may occur with improper specimen collection / handling, submission of specimen other than nasopharyngeal swab, presence of viral mutation(s) within the areas targeted by this assay, and inadequate number of viral copies (<250 copies / mL). A negative result must be combined with clinical observations,  patient history, and epidemiological information.  Fact Sheet for Patients:   RoadLapTop.co.za  Fact Sheet for Healthcare Providers: http://kim-miller.com/  This test is not yet approved or  cleared by the [place] FDA and has been authorized for detection and/or diagnosis of SARS-CoV-2 by FDA under an Emergency Use Authorization (EUA).  This EUA will remain in effect (meaning this test can be used) for the duration of the COVID-19 declaration under Section 564(b)(1) of the Act, 21 U.S.C. section 360bbb-3(b)(1), unless the authorization is terminated or revoked sooner.  Performed at [facility], [address] [place], [place] [address]      Labs: BNP (last 3 results) Recent Labs    08/08/22 0203  BNP 277.6*   Basic Metabolic Panel: Recent Labs  Lab 08/07/22 2210 08/09/22 0621 08/10/22 0552  NA 138 139 139  K 3.5 3.5 4.0  CL 104 104 103  CO2 27 27 28   GLUCOSE 106* 106* 87  BUN 13 13 16   CREATININE 1.09* 0.89 1.05*  CALCIUM 8.6* 8.8* 8.9   Liver Function Tests: Recent Labs  Lab 08/07/22 2210  AST 34  ALT 37  ALKPHOS 79  BILITOT 0.8  PROT 7.5  ALBUMIN 3.7   No results for input(s): "LIPASE", "AMYLASE" in the last 168 hours. No results for input(s): "AMMONIA" in the last 168 hours. CBC: Recent Labs  Lab 08/07/22 2210  WBC 12.6*  NEUTROABS 8.0*  HGB 14.4  HCT 43.2  MCV 91.5  PLT 364   Cardiac Enzymes: No results for input(s): "CKTOTAL", "CKMB", "CKMBINDEX", "TROPONINI" in the last 168 hours. BNP: Invalid input(s): "POCBNP" CBG: No results for input(s): "GLUCAP" in the last 168 hours. D-Dimer No results for input(s): "DDIMER" in the last 72 hours. Hgb A1c No results for input(s): "HGBA1C" in the last 72 hours. Lipid Profile Recent Labs    08/08/22 1751  CHOL 236*  HDL 33*  LDLCALC 178*  TRIG 126  CHOLHDL 7.2   Thyroid function studies Recent Labs    08/08/22 1751  TSH  2.099   Anemia work up No results for input(s): "VITAMINB12", "FOLATE", "FERRITIN", "TIBC", "IRON", "RETICCTPCT" in the last 72 hours. Urinalysis    Component Value Date/Time   COLORURINE YELLOW 11/27/2020 0218   APPEARANCEUR CLOUDY (A) 11/27/2020 0218   LABSPEC >1.030 (H) 11/27/2020 0218   PHURINE  5.5 11/27/2020 0218   GLUCOSEU NEGATIVE 11/27/2020 0218   HGBUR LARGE (A) 11/27/2020 0218   BILIRUBINUR NEGATIVE 11/27/2020 0218   KETONESUR NEGATIVE 11/27/2020 0218   PROTEINUR NEGATIVE 11/27/2020 0218   NITRITE NEGATIVE 11/27/2020 0218   LEUKOCYTESUR NEGATIVE 11/27/2020 0218   Sepsis Labs Recent Labs  Lab 08/07/22 2210  WBC 12.6*   Microbiology Recent Results (from the past 240 hour(s))  SARS Coronavirus 2 by RT PCR (hospital order, performed in [facility] hospital lab) *cepheid single result test* Anterior Nasal Swab     Status: None   Collection Time: 08/08/22  4:38 AM   Specimen: Anterior Nasal Swab  Result Value Ref Range Status   SARS Coronavirus 2 by RT PCR NEGATIVE NEGATIVE Final    Comment: (NOTE) SARS-CoV-2 target nucleic acids are NOT DETECTED.  The SARS-CoV-2 RNA is generally detectable in upper and lower respiratory specimens during the acute phase of infection. The lowest concentration of SARS-CoV-2 viral copies this assay can detect is 250 copies / mL. A negative result does not preclude SARS-CoV-2 infection and should not be used as the sole basis for treatment or other patient management decisions.  A negative result may occur with improper specimen collection / handling, submission of specimen other than nasopharyngeal swab, presence of viral mutation(s) within the areas targeted by this assay, and inadequate number of viral copies (<250 copies / mL). A negative result must be combined with clinical observations, patient history, and epidemiological information.  Fact Sheet for Patients:   RoadLapTop.co.za  Fact Sheet for  Healthcare Providers: http://kim-miller.com/  This test is not yet approved or  cleared by the [place] FDA and has been authorized for detection and/or diagnosis of SARS-CoV-2 by FDA under an Emergency Use Authorization (EUA).  This EUA will remain in effect (meaning this test can be used) for the duration of the COVID-19 declaration under Section 564(b)(1) of the Act, 21 U.S.C. section 360bbb-3(b)(1), unless the authorization is terminated or revoked sooner.  Performed at [facility], [address] [place], [place] [address]    Imaging ECHOCARDIOGRAM COMPLETE  Result Date: 08/09/2022    ECHOCARDIOGRAM REPORT   Patient Name:   Sabrina Mata Date of Exam: 08/09/2022 Medical Rec #:  [id number]                Height:       61.0 in Accession #:    [id number]               Weight:       250.0 lb Date of Birth:  [birth year]                 BSA:          2.077 m Patient Age:    41 years                 BP:           113/46 mmHg Patient Gender: F                        HR:           89 bpm. Exam Location:  [facility] Procedure: 2D Echo, Cardiac Doppler, Color Doppler and Intracardiac            Opacification Agent Indications:     CHF  History:         Patient has no prior history of Echocardiogram examinations.  CHF; Risk Factors:Current Smoker.  Sonographer:     Mikki Harbor Referring Phys:  [id number] ALEXIS HUGELMEYER Diagnosing Phys: Julien Nordmann MD  Sonographer Comments: Technically difficult study due to poor echo windows and patient is obese. IMPRESSIONS  1. Left ventricular ejection fraction, by estimation, is 30 to 35%. Left ventricular ejection fraction by 2D MOD biplane is 33.2 %. The left ventricle has moderately decreased function. The left ventricle demonstrates global hypokinesis. The left ventricular internal cavity size was mildly dilated. Left ventricular diastolic parameters are consistent with Grade II diastolic dysfunction  (pseudonormalization).  2. Right ventricular systolic function is normal. The right ventricular size is normal. Tricuspid regurgitation signal is inadequate for assessing PA pressure.  3. The mitral valve is normal in structure. Mild to moderate mitral valve regurgitation. No evidence of mitral stenosis.  4. The aortic valve has an indeterminant number of cusps. Aortic valve regurgitation is not visualized. No aortic stenosis is present.  5. The inferior vena cava is normal in size with greater than 50% respiratory variability, suggesting right atrial pressure of 3 mmHg. FINDINGS  Left Ventricle: Left ventricular ejection fraction, by estimation, is 30 to 35%. Left ventricular ejection fraction by 2D MOD biplane is 33.2 %. The left ventricle has moderately decreased function. The left ventricle demonstrates global hypokinesis. Definity contrast agent was given IV to delineate the left ventricular endocardial borders. The left ventricular internal cavity size was mildly dilated. There is no left ventricular hypertrophy. Left ventricular diastolic parameters are consistent with Grade II diastolic dysfunction (pseudonormalization). Right Ventricle: The right ventricular size is normal. No increase in right ventricular wall thickness. Right ventricular systolic function is normal. Tricuspid regurgitation signal is inadequate for assessing PA pressure. Left Atrium: Left atrial size was normal in size. Right Atrium: Right atrial size was normal in size. Pericardium: There is no evidence of pericardial effusion. Mitral Valve: The mitral valve is normal in structure. Mild to moderate mitral valve regurgitation. No evidence of mitral valve stenosis. MV peak gradient, 9.0 mmHg. The mean mitral valve gradient is 3.0 mmHg. Tricuspid Valve: The tricuspid valve is normal in structure. Tricuspid valve regurgitation is not demonstrated. No evidence of tricuspid stenosis. Aortic Valve: The aortic valve has an indeterminant number of  cusps. Aortic valve regurgitation is not visualized. No aortic stenosis is present. Aortic valve mean gradient measures 5.0 mmHg. Aortic valve peak gradient measures 9.7 mmHg. Aortic valve area, by VTI measures 2.67 cm. Pulmonic Valve: The pulmonic valve was normal in structure. Pulmonic valve regurgitation is mild. No evidence of pulmonic stenosis. Aorta: The aortic root is normal in size and structure. Venous: The inferior vena cava is normal in size with greater than 50% respiratory variability, suggesting right atrial pressure of 3 mmHg. IAS/Shunts: No atrial level shunt detected by color flow Doppler.  LEFT VENTRICLE PLAX 2D                        Biplane EF (MOD) LVIDd:         5.30 cm         LV Biplane EF:   Left LVIDs:         3.80 cm                          ventricular LV PW:         1.20 cm  ejection LV IVS:        1.20 cm                          fraction by LVOT diam:     1.90 cm                          2D MOD LV SV:         72                               biplane is LV SV Index:   35                               33.2 %. LVOT Area:     2.84 cm                                Diastology                                LV e' medial:    7.18 cm/s LV Volumes (MOD)               LV E/e' medial:  16.4 LV vol d, MOD    118.0 ml      LV e' lateral:   6.53 cm/s A2C:                           LV E/e' lateral: 18.1 LV vol d, MOD    133.0 ml A4C: LV vol s, MOD    75.8 ml A2C: LV vol s, MOD    96.6 ml A4C: LV SV MOD A2C:   42.2 ml LV SV MOD A4C:   133.0 ml LV SV MOD BP:    42.8 ml RIGHT VENTRICLE RV Basal diam:  2.90 cm RV Mid diam:    2.60 cm RV S prime:     14.40 cm/s TAPSE (M-mode): 2.8 cm LEFT ATRIUM             Index        RIGHT ATRIUM           Index LA diam:        3.40 cm 1.64 cm/m   RA Area:     14.70 cm LA Vol (A2C):   53.1 ml 25.57 ml/m  RA Volume:   33.60 ml  16.18 ml/m LA Vol (A4C):   48.1 ml 23.16 ml/m LA Biplane Vol: 50.7 ml 24.41 ml/m  AORTIC VALVE                     PULMONIC VALVE AV Area (Vmax):    2.36 cm     PV Vmax:       0.86 m/s AV Area (Vmean):   2.26 cm     PV Peak grad:  3.0 mmHg AV Area (VTI):     2.67 cm AV Vmax:           156.00 cm/s AV Vmean:          98.400 cm/s AV VTI:            0.269 m  AV Peak Grad:      9.7 mmHg AV Mean Grad:      5.0 mmHg LVOT Vmax:         130.00 cm/s LVOT Vmean:        78.500 cm/s LVOT VTI:          0.253 m LVOT/AV VTI ratio: 0.94  AORTA Ao Root diam: 3.50 cm MITRAL VALVE MV Area (PHT): 4.65 cm     SHUNTS MV Area VTI:   2.58 cm     Systemic VTI:  0.25 m MV Peak grad:  9.0 mmHg     Systemic Diam: 1.90 cm MV Mean grad:  3.0 mmHg MV Vmax:       1.50 m/s MV Vmean:      86.5 cm/s MV Decel Time: 163 msec MR PISA:        1.27 cm MR PISA Radius: 0.45 cm MV E velocity: 118.00 cm/s MV A velocity: 84.90 cm/s MV E/A ratio:  1.39 Julien Nordmann MD Electronically signed by Julien Nordmann MD Signature Date/Time: 08/09/2022/12:27:34 PM    Final       Time coordinating discharge: over 30 minutes  SIGNED:  Sunnie Nielsen DO [facility]

## 2022-08-10 NOTE — Progress Notes (Signed)
Heart Failure Nurse Navigator Note  Met with patient today, she was lying in bed in no acute distress.  States she feels almost back to normal, has been ambulating around her room and also out in the hall.  She states she did not noticed any SOB like what she had experienced before admission.  By teach back went over daily weights, what to report etc.  Needed no reinforcement.  States she has made up her mind and has also talked with her family, she is not going to smoke or drink ETOH.  Also as much as she does not like to take medications, she realizes for her to become healthier and if her heart is to return to normal she will be compliant.  She also wants to be around for her two daughters.  She had no further questions.  Tresa Endo RN [facility]

## 2022-08-10 NOTE — Care Management Important Message (Signed)
Important Message  Patient Details  Name: Sabrina Mata MRN: [id number] Date of Birth: May 18, 1980   Medicare Important Message Given:  Yes     Johnell Comings 08/10/2022, 2:07 PM

## 2022-08-10 NOTE — Progress Notes (Signed)
Rounding Note    Patient Name: Sabrina Mata Long Date of Encounter: 08/10/2022  [facility] Cardiologist: None  New consult done by Dr Mariah Milling  Subjective   Patient seen on AM rounds.  Denies any chest pain or shortness of breath.Has been ambulating around the unit without difficulty. Anxious to go home. Daughter is in the room at the bedside.  Inpatient Medications    Scheduled Meds:  aspirin EC  81 mg Oral Daily   carvedilol  3.125 mg Oral Once   carvedilol  6.25 mg Oral BID WC   empagliflozin  10 mg Oral Daily   enoxaparin (LOVENOX) injection  40 mg Subcutaneous Q24H   folic acid  1 mg Oral Daily   furosemide  40 mg Intravenous BID   losartan  12.5 mg Oral Daily   multivitamin with minerals  1 tablet Oral Daily   nicotine  14 mg Transdermal Daily   potassium chloride  40 mEq Oral Daily   sodium chloride flush  3 mL Intravenous Q12H   thiamine  100 mg Oral Daily   Continuous Infusions:  sodium chloride     PRN Meds: sodium chloride, acetaminophen, ipratropium-albuterol, ondansetron (ZOFRAN) IV, sodium chloride flush   Vital Signs    Vitals:   08/10/22 0409 08/10/22 0500 08/10/22 0806 08/10/22 1141  BP: (!) 109/56  120/84 (!) 131/97  Pulse: 86  85 (!) 108  Resp: 18  18 18   Temp: 98.1 F (36.7 C)  97.7 F (36.5 C) (!) 97.4 F (36.3 C)  TempSrc:    Oral  SpO2: 97%  98% 100%  Weight:  106.7 kg    Height:        Intake/Output Summary (Last 24 hours) at 08/10/2022 1149 Last data filed at 08/10/2022 1143 Gross per 24 hour  Intake 1080 ml  Output --  Net 1080 ml      08/10/2022    5:00 AM 08/09/2022    2:20 PM 08/07/2022   10:07 PM  Last 3 Weights  Weight (lbs) 235 lb 4.8 oz 254 lb 9.6 oz 250 lb  Weight (kg) 106.731 kg 115.486 kg 113.399 kg      Telemetry    Sinus to sinus tach rates 80-112 - Personally Reviewed  ECG    No new tracings - Personally Reviewed  Physical Exam   GEN: No acute distress.   Neck: No JVD Cardiac: RRR, no  murmurs, rubs, or gallops.  Respiratory: Clear to auscultation bilaterally. GI: Soft, nontender, non-distended  MS: No edema; No deformity. Neuro:  Nonfocal  Psych: Normal affect   Labs    High Sensitivity Troponin:   Recent Labs  Lab 08/07/22 2210 08/08/22 0131 08/08/22 1751 08/09/22 0621  TROPONINIHS 12 15 14 13      Chemistry Recent Labs  Lab 08/07/22 2210 08/09/22 0621 08/10/22 0552  NA 138 139 139  K 3.5 3.5 4.0  CL 104 104 103  CO2 27 27 28   GLUCOSE 106* 106* 87  BUN 13 13 16   CREATININE 1.09* 0.89 1.05*  CALCIUM 8.6* 8.8* 8.9  PROT 7.5  --   --   ALBUMIN 3.7  --   --   AST 34  --   --   ALT 37  --   --   ALKPHOS 79  --   --   BILITOT 0.8  --   --   GFRNONAA >60 >60 >60  ANIONGAP 7 8 8     Lipids  Recent Labs  Lab 08/08/22 1751  CHOL 236*  TRIG 126  HDL 33*  LDLCALC 178*  CHOLHDL 7.2    Hematology Recent Labs  Lab 08/07/22 2210  WBC 12.6*  RBC 4.72  HGB 14.4  HCT 43.2  MCV 91.5  MCH 30.5  MCHC 33.3  RDW 14.0  PLT 364   Thyroid  Recent Labs  Lab 08/08/22 1751  TSH 2.099    BNP Recent Labs  Lab 08/08/22 0203  BNP 277.6*    DDimer No results for input(s): "DDIMER" in the last 168 hours.   Radiology      Cardiac Studies  2D echo 08/09/22 1. Left ventricular ejection fraction, by estimation, is 30 to 35%. Left  ventricular ejection fraction by 2D MOD biplane is 33.2 %. The left  ventricle has moderately decreased function. The left ventricle  demonstrates global hypokinesis. The left  ventricular internal cavity size was mildly dilated. Left ventricular  diastolic parameters are consistent with Grade II diastolic dysfunction  (pseudonormalization).   2. Right ventricular systolic function is normal. The right ventricular  size is normal. Tricuspid regurgitation signal is inadequate for assessing  PA pressure.   3. The mitral valve is normal in structure. Mild to moderate mitral valve  regurgitation. No evidence of mitral  stenosis.   4. The aortic valve has an indeterminant number of cusps. Aortic valve  regurgitation is not visualized. No aortic stenosis is present.   5. The inferior vena cava is normal in size with greater than 50%  respiratory variability, suggesting right atrial pressure of 3 mmHg.   Patient Profile     42 y.o. female with a histroy of anxiety, depression, bipolar disorder, current smoker, previous COVID-19 infection, alcohol abuse, has been seen and evaluated for newly found HFrEF.  Assessment & Plan    HFrEF -Shortness of breath has improved -Weight has decreased since admission -Has been weaned off of oxygen therapy -GDMT escalated this morning with losartan increased to 25 mg daily carvedilol increased to 6.25 mg twice daily -Continued on Jardiance 10 mg daily -Transition IV furosemide to oral diuretic prior to discharge -We can consider transitioning to [facility] as outpatient -Will need to be scheduled for coronary CTA in the outpatient setting to finish ischemic workup for acute onset systolic heart failure -Continue with heart failure education -Daily weights, I's and O's, low-sodium diet  Hypertension -Blood pressure 120/84 -Carvedilol and losartan increased -Vital signs per unit protocol  Polysubstance abuse with alcohol and tobacco -States that she had a large quantity of alcohol intake, patient states is when she is home no more alcohol -Patient is placed on NicoDerm CQ patch for smoking, patient states that she will no longer be smoking -Total cessation is recommended for both  Morbid obesity -BMI 44.46 -Encouraged to increase activity and decrease caloric intake -Weight loss is recommended     For questions or updates, please contact [facility] Please consult [email or web address] for contact info under        Signed, Zunairah Devers, NP  08/10/2022, 11:49 AM

## 2022-08-10 NOTE — Telephone Encounter (Signed)
Pharmacy Patient Advocate Encounter  Insurance verification completed.    The patient is insured through [employer]   The patient is currently admitted and ran test claims for the following: Valla Leaver.  Copays and coinsurance results were relayed to Inpatient clinical team.

## 2022-08-23 ENCOUNTER — Other Ambulatory Visit (HOSPITAL_COMMUNITY): Payer: Self-pay

## 2022-08-23 ENCOUNTER — Ambulatory Visit (HOSPITAL_BASED_OUTPATIENT_CLINIC_OR_DEPARTMENT_OTHER): Payer: Medicare Other | Admitting: Family

## 2022-08-23 ENCOUNTER — Encounter: Payer: Self-pay | Admitting: Family

## 2022-08-23 ENCOUNTER — Other Ambulatory Visit
Admission: RE | Admit: 2022-08-23 | Discharge: 2022-08-23 | Disposition: A | Discharge status: Home / Self Care | Payer: Medicare Other | Source: Ambulatory Visit | Attending: Family | Admitting: Family

## 2022-08-23 VITALS — BP 105/63 | HR 101 | Wt 234.8 lb

## 2022-08-23 DIAGNOSIS — F1010 Alcohol abuse, uncomplicated: Secondary | ICD-10-CM

## 2022-08-23 DIAGNOSIS — I5022 Chronic systolic (congestive) heart failure: Secondary | ICD-10-CM | POA: Insufficient documentation

## 2022-08-23 DIAGNOSIS — R000 Tachycardia, unspecified: Secondary | ICD-10-CM | POA: Diagnosis not present

## 2022-08-23 DIAGNOSIS — R0683 Snoring: Secondary | ICD-10-CM

## 2022-08-23 LAB — BASIC METABOLIC PANEL
Anion gap: 9 (ref 5–15)
BUN: 20 mg/dL (ref 6–20)
CO2: 29 mmol/L (ref 22–32)
Calcium: 9.3 mg/dL (ref 8.9–10.3)
Chloride: 102 mmol/L (ref 98–111)
Creatinine, Ser: 1.14 mg/dL — ABNORMAL HIGH (ref 0.44–1.00)
GFR, Estimated: >60 mL/min (ref >60)
Glucose, Bld: 99 mg/dL (ref 70–99)
Potassium: 4.7 mmol/L (ref 3.5–5.1)
Sodium: 140 mmol/L (ref 135–145)

## 2022-08-23 NOTE — Progress Notes (Addendum)
Advanced Heart Failure Clinic Note   PCP: Pcp, No PCP-Cardiologist: [facility] during recent admission  HPI:  [name] is a 42 y/o female with a history of anxiety (2004), bipolar, depression, hyperlipidemia, previous alcohol use, morbid obesity, previous tobacco use and chronic heart failure.   Echo 08/11/52: EF 30-35% along with Grade II DD and mild/moderate MR.   Chest CT 08/08/22: with cardiomegaly, trace bilateral pleural effusions   Admitted 08/08/22 due to SOB, DOE and orthopnea for a week prior. Covid 2023. CTA negative for PE. Initially needed bipap and then transitioned to 4L  She presents today for her initial visit with a chief complaint of minimal fatigue with moderate exertion. Says that this has improved greatly since her recent admission. She has anxiety & snoring along with this. She is tearful in the office because she's afraid of dying from this and wants to be around for her family. She has made great lifestyle changes since her admission. She and her daughter now walk 1-2 laps at the mall daily, she doesn't add salt to her food and hasn't smoked or drank any alcohol since her admission.   She is a single mom of 65 & 1 y/o daughters. Gets disability due to her mental health. In 2021, she had 4 loved ones die from covid (grandmother, mother, uncle and mother's boyfriend) and she says that after all of that happened, she started smoking 1-1.5 packs of cigarettes at night as well as drink 1/2-1 bottle of tequila at night to numb the pain. Smoke and drank like this for the last 3 years. Denies having any withdrawal symptoms since stoppage a couple of weeks ago.    Spent a lot of time discussing heart failure, medications, lifestyle changes and possible further testing.   Review of Systems: [y] = yes, [ ]  = no   General: Weight gain [ ] ; Weight loss [ ] ; Anorexia [ ] ; Fatigue [ y]; Fever [ ] ; Chills [ ] ; Weakness [ ]   Cardiac: Chest pain/pressure [ ] ; Resting SOB [ ] ; Exertional  SOB [ ] ; Orthopnea [ ] ; Pedal Edema [ ] ; Palpitations [ ] ; Syncope [ ] ; Presyncope [ ] ; Paroxysmal nocturnal dyspnea[ ]   Pulmonary: Cough [ ] ; Wheezing[ ] ; Hemoptysis[ ] ; Sputum [ ] ; Snoring [ ]   GI: Vomiting[ ] ; Dysphagia[ ] ; Melena[ ] ; Hematochezia [ ] ; Heartburn[ ] ; Abdominal pain [ ] ; Constipation [ ] ; Diarrhea [ ] ; BRBPR [ ]   GU: Hematuria[ ] ; Dysuria [ ] ; Nocturia[ ]   Vascular: Pain in legs with walking [ ] ; Pain in feet with lying flat [ ] ; Non-healing sores [ ] ; Stroke [ ] ; TIA [ ] ; Slurred speech [ ] ;  Neuro: Headaches[ ] ; Vertigo[ ] ; Seizures[ ] ; Paresthesias[ ] ;Blurred vision [ ] ; Diplopia [ ] ; Vision changes [ ]   Ortho/Skin: Arthritis [ ] ; Joint pain [ ] ; Muscle pain [ ] ; Joint swelling [ ] ; Back Pain [ ] ; Rash [ ]   Psych: Depression[ ] ; Anxiety[ y]  Heme: Bleeding problems [ ] ; Clotting disorders [ ] ; Anemia [ ]   Endocrine: Diabetes [ ] ; Thyroid dysfunction[ ]    Past Medical History:  Diagnosis Date   Anxiety    Bipolar 1 disorder, depressed (HCC)    Depression     Current Outpatient Medications  Medication Sig Dispense Refill   aspirin EC 81 MG tablet Take 1 tablet (81 mg total) by mouth daily. Swallow whole. 30 tablet 0   carvedilol (COREG) 6.25 MG tablet Take 1 tablet (6.25 mg total) by mouth 2 (two)  times daily with a meal. 60 tablet 0   empagliflozin (JARDIANCE) 10 MG TABS tablet Take 1 tablet (10 mg total) by mouth daily. 30 tablet 0   folic acid (FOLVITE) 1 MG tablet Take 1 tablet (1 mg total) by mouth daily. 30 tablet 0   furosemide (LASIX) 40 MG tablet Take 1 tablet (40 mg total) by mouth daily. Increase to 1 tablet (40 mg total) by mouth TWICE daily (total daily dose 80 mg) as needed for increased leg swelling, shortness of breath, weight gain 5+ lbs over 1-2 days. Seek medical care if these symptoms are not improving with increased dose. 60 tablet 0   losartan (COZAAR) 25 MG tablet Take 1 tablet (25 mg total) by mouth daily. 30 tablet 0   Multiple Vitamin  (MULTIVITAMIN WITH MINERALS) TABS tablet Take 1 tablet by mouth daily.     potassium chloride SA (KLOR-CON M) 20 MEQ tablet Take 1 tablet (20 mEq total) by mouth daily. 30 tablet 0   thiamine (VITAMIN B-1) 100 MG tablet Take 1 tablet (100 mg total) by mouth daily. 30 tablet 0   No current facility-administered medications for this visit.    No Known Allergies    Social History   Socioeconomic History   Marital status: Single    Spouse name: NA   Number of children: 2   Years of education: 11   Highest education level: GED or equivalent  Occupational History   Occupation: unemployed   Occupation: SSI   Tobacco Use   Smoking status: Every Day    Packs/day: 0.50    Years: 20.00    Additional pack years: 0.00    Total pack years: 10.00    Types: Cigarettes   Smokeless tobacco: Never   Tobacco comments:    denies 2nd smoke  Vaping Use   Vaping Use: Never used  Substance and Sexual Activity   Alcohol use: Yes    Alcohol/week: 2.0 standard drinks of alcohol    Types: 2 Shots of liquor per week    Comment: patient drinks 1-3x per week    Drug use: Not Currently   Sexual activity: Not on file  Other Topics Concern   Not on file  Social History Narrative   Patient is a single mom of two children ages 34 and 55yo. She receives disability due to her mental health diagnoses. She has one close friend and has a supportive friendship with the father of her children. She lost 4 loved ones due to Covid-19 from 04/2019 - 06/2019, including her grandmother, her mother, her uncle and her mother's boyfriend. She also recently suffered a miscarriage.    Social Determinants of Health   Financial Resource Strain: Not on file  Food Insecurity: No Food Insecurity (08/08/2022)   Hunger Vital Sign    Worried About Running Out of Food in the Last Year: Never true    Ran Out of Food in the Last Year: Never true  Transportation Needs: No Transportation Needs (08/08/2022)   PRAPARE - Therapist, art (Medical): No    Lack of Transportation (Non-Medical): No  Physical Activity: Not on file  Stress: Not on file  Social Connections: Not on file  Intimate Partner Violence: Not At Risk (08/08/2022)   Humiliation, Afraid, Rape, and Kick questionnaire    Fear of Current or Ex-Partner: No    Emotionally Abused: No    Physically Abused: No    Sexually Abused: No    Family  History  Problem Relation Age of Onset   COPD Mother    Heart disease Mother    Drug abuse Mother    Sickle cell trait Brother    Vitals:   08/23/22 1002  BP: 105/63  Pulse: (!) 101  SpO2: 99%  Weight: 234 lb 12.8 oz (106.5 kg)   Wt Readings from Last 3 Encounters:  08/23/22 234 lb 12.8 oz (106.5 kg)  08/10/22 235 lb 4.8 oz (106.7 kg)  05/25/21 249 lb 1.9 oz (113 kg)   Lab Results  Component Value Date   CREATININE 1.05 (H) 08/10/2022   CREATININE 0.89 08/09/2022   CREATININE 1.09 (H) 08/07/2022    PHYSICAL EXAM: General:  Well appearing. No respiratory difficulty HEENT: normal Neck: supple. no JVD. Carotids 2+ bilat; no bruits. No lymphadenopathy or thyromegaly appreciated. Cor: PMI nondisplaced. Regular rhythm. Tachycardic. No rubs, gallops or murmurs. Lungs: clear Abdomen: soft, nontender, nondistended. No hepatosplenomegaly. No bruits or masses. Good bowel sounds. Extremities: no cyanosis, clubbing, rash, edema Neuro: alert & oriented x 3, cranial nerves grossly intact. moves all 4 extremities w/o difficulty. Affect pleasant. Anxious and tearful  ECG: 08/07/22 showed ST (unchanged since 02/23)  ASSESSMENT & PLAN:  1: Chronic heart failure with reduced ejection fraction- - NYHA class II - euvolemic today - etiology could be previous alcohol use or undiagnosed OSA - weighing daily; instructed to call for an overnight weight gain of > 2 pounds or a weekly weight gain of > 5 pounds - Echo 08/11/52: EF 30-35% along with Grade II DD and mild/moderate MR.  - Chest CT 08/08/22: with  cardiomegaly, trace bilateral pleural effusions  - continue carvedilol 6.25mg  BID - continue jardiance 10mg  daily - continue furosemide 40mg  BID w/ potassium daily - continue losartan 25mg  daily - BMP today - discussed GDMT and that we will probably start making changes at next visit - discuss cMRI at next visit and if EF remains low or develops chest pain, begin ischemic workup with possible cath - will also refer to HF provider - emotional support given as patient is tearful in the office due to HF diagnosis - walking 1-2 laps at [facility] every day - BNP 08/08/22 was 277.6 - PharmD reconciled meds w/ patient  2: Tobacco/ Alcohol use- - BMP 08/10/22 showed sodium 139, potassium 4.0, creatinine 1.05 & GFR >60 - has not smoked or had any alcohol use since recent admission 08/08/22; had drank heavily for previous 3 years (1/2-1 bottle of tequila nightly) - congratulated her on this and encouraged her to continue abstaining  3: ST- - per previous EKG's, ST has been present since 02/23 [facility] cardiology saw patient during recent admission - on carvedilol 6.25mg  BID  4: Snoring- - boyfriend has commented on her snoring although he says that it is less severe now - will attempt to get home sleep study approved by her insurance to rule out sleep apnea  Return in 2 weeks, sooner if needed.  Delma Freeze, FNP 08/23/22

## 2022-08-23 NOTE — Progress Notes (Signed)
[facility] FAIL[facility]INIC - Pharmacist Note  Zynasia Johnia Devi is a 42 y.o. female with HFrEF (EF <40%) presenting to the Heart Failure Clinic to establish care. Patient reports no medication access issues and no adverse effects from current regimen. She reports feeling much better since her admission earlier this month. She reports significant reduction in swelling and shortness of breath. She states that she has changed her diet to adhere to sodium restriction and has incorporated lots of fruit into her diet. We discussed different strategies to minimize salt content while cooking. She has been drinkinking 1-1.5 bottles of water daily. We discussed restriction to 64 oz of fluid daily and that fruits can have high water content. Recommended to limit fluid intake to 2-3 bottles of water on days when she is eating a lot of fruit (watermelon, grapes, fruit cups). She reports that she has been exercising with her daughter daily.   Recent ED Visit (past 6 months):  Date: 08/08/2022, CC: SOB  Guideline-Directed Medical Therapy/Evidence Based Medicine ACE/ARB/ARNI: Losartan 25 mg daily Beta Blocker: Carvedilol 6.25 mg twice daily Aldosterone Antagonist:  none Diuretic: Furosemide 40 mg twice daily SGLT2i: Empagliflozin 10 mg daily  Adherence Assessment Do you ever forget to take your medication? [] Yes [x] No  Do you ever skip doses due to side effects? [] Yes [x] No  Do you have trouble affording your medicines? [] Yes [x] No  Are you ever unable to pick up your medication due to transportation difficulties? [] Yes [x] No  Do you ever stop taking your medications because you don't believe they are helping? [] Yes [x] No  Do you check your weight daily? [x] Yes (keeps log) [] No  Adherence strategy: pill box Barriers to obtaining medications: none reported  Diagnostics ECHO: Date 08/09/2022, EF 30-35%, GHK, G2DD  Vitals    08/23/2022   10:02 AM 08/10/2022   11:41 AM 08/10/2022    8:06 AM  Vitals  with BMI  Weight 234 lbs 13 oz    BMI 44.39    Systolic 105 131 034  Diastolic 63 97 84  Pulse 101 108 85     Recent Labs    Latest Ref Rng & Units 08/10/2022    5:52 AM 08/09/2022    6:21 AM 08/07/2022   10:10 PM  BMP  Glucose 70 - 99 mg/dL 87  742  595   BUN 6 - 20 mg/dL 16  13  13    Creatinine 0.44 - 1.00 mg/dL 6.38  7.56  4.33   Sodium 135 - 145 mmol/L 139  139  138   Potassium 3.5 - 5.1 mmol/L 4.0  3.5  3.5   Chloride 98 - 111 mmol/L 103  104  104   CO2 22 - 32 mmol/L 28  27  27    Calcium 8.9 - 10.3 mg/dL 8.9  8.8  8.6     Past Medical History Past Medical History:  Diagnosis Date   Anxiety    Bipolar 1 disorder, depressed (HCC)    CHF (congestive heart failure) (HCC)    Depression    Hyperlipidemia     Plan Continue current regimen as directed by NP Consider switching losartan to Entresto ($11.20 for 30 or 90 day supply) Titrate carvedilol as BP allows Consider switch from carvedilol to metoprolol for more targeted HR control with reduced effect on BP Consider adding MRA as BP allows  Time spent: 20 minutes  Celene Squibb, PharmD PGY1 Pharmacy Resident 08/23/2022 11:13 AM

## 2022-08-23 NOTE — Progress Notes (Signed)
Height: 5'1"    Weight:234lb BMI:44  Today's Date:08/23/22  STOP BANG RISK ASSESSMENT S (snore) Have you been told that you snore?     YES   T (tired) Are you often tired, fatigued, or sleepy during the day?   YES  O (obstruction) Do you stop breathing, choke, or gasp during sleep? NO   P (pressure) Do you have or are you being treated for high blood pressure? YES  B (BMI) Is your body index greater than 35 kg/m? YES   A (age) Are you 42 years old or older? NO   N (neck) Do you have a neck circumference greater than 16 inches?   NO   G (gender) Are you a female? NO   TOTAL STOP/BANG "YES" ANSWERS 4                                                                       For Office Use Only              Procedure Order Form    YES to 3+ Stop Bang questions OR two clinical symptoms - patient qualifies for WatchPAT (CPT 95800)      Clinical Notes: Will consult Sleep Specialist and refer for management of therapy due to patient increased risk of Sleep Apnea. Ordering a sleep study due to the following two clinical symptoms: Excessive daytime sleepiness G47.10  / Morning Headaches G44.221 / Difficulty concentrating R41.840 / Memory problems or poor judgment G31.84 / Personality changes or irritability R45.4 / Loud snoring R06.83 / Depression F32.9 / Unrefreshed by sleep G47.8 / Insomnia G47.00

## 2022-08-23 NOTE — Patient Instructions (Addendum)
Continue weighing daily and call for an overnight weight gain of 3 pounds or more or a weekly weight gain of more than 5 pounds.  Drink 60-64oz of fluids daily   [facility] your [facility] work drawn.

## 2022-08-24 ENCOUNTER — Telehealth: Payer: Self-pay

## 2022-08-24 DIAGNOSIS — I5022 Chronic systolic (congestive) heart failure: Secondary | ICD-10-CM

## 2022-08-24 NOTE — Telephone Encounter (Addendum)
Home Sleep Study given to pt during her visit. Device education given with teach back. Called pt to let her know that no precert was required and that she was able to start her sleep study tonight. Pt aware, agreeable, and verbalized understanding. Code given.  Sabrina Mata home sleep study given to patient, all instructions explained, waiver signed, and CLOUDPAT registration complete.

## 2022-08-27 ENCOUNTER — Telehealth: Payer: Self-pay

## 2022-08-27 NOTE — Telephone Encounter (Signed)
Patient contacted the clinic this morning regarding her sleep study device she stated she could not get it to work. When she called the 800 number listed for support she was told that the device was not registered.

## 2022-08-29 ENCOUNTER — Telehealth: Payer: Self-pay

## 2022-08-29 ENCOUNTER — Encounter (INDEPENDENT_AMBULATORY_CARE_PROVIDER_SITE_OTHER): Payer: Medicare Other | Admitting: Cardiology

## 2022-08-29 DIAGNOSIS — G4733 Obstructive sleep apnea (adult) (pediatric): Secondary | ICD-10-CM | POA: Diagnosis not present

## 2022-08-29 NOTE — Progress Notes (Signed)
Notified pt of the following:  "She needs to be cautious about using NoSalt seasoning as it can sometimes elevate her potassium level. If she wants to use it, we can check labs more frequently to monitor this."  Patient stated she would not use the diet and would continue with the low sodium diet that she was advised to use by Clarisa Kindred, FNP.

## 2022-08-29 NOTE — Telephone Encounter (Signed)
Spoke with pt regarding [place]. Pt will start test tonight.  Ensured pt was registered.  Test ready to be started.

## 2022-08-29 NOTE — Progress Notes (Signed)
Pt called to follow up about her sleep study technical issues. Notified pt that the nurse would call as soon as they can. Pt also asking if she can do a "no salt, salt subsititue" diet instead of the low sodium diet. Pt states she thinks the diet may have potassium in place of the salt. Notified pt that we would call her back after discussing with the provider.  Please advise.

## 2022-09-06 ENCOUNTER — Ambulatory Visit: Payer: Medicare Other | Attending: Family | Admitting: Family

## 2022-09-06 VITALS — BP 82/60 | HR 92 | Ht 61.0 in | Wt 231.0 lb

## 2022-09-06 DIAGNOSIS — E785 Hyperlipidemia, unspecified: Secondary | ICD-10-CM | POA: Insufficient documentation

## 2022-09-06 DIAGNOSIS — F419 Anxiety disorder, unspecified: Secondary | ICD-10-CM | POA: Insufficient documentation

## 2022-09-06 DIAGNOSIS — Z79899 Other long term (current) drug therapy: Secondary | ICD-10-CM | POA: Insufficient documentation

## 2022-09-06 DIAGNOSIS — R000 Tachycardia, unspecified: Secondary | ICD-10-CM | POA: Diagnosis not present

## 2022-09-06 DIAGNOSIS — R0609 Other forms of dyspnea: Secondary | ICD-10-CM | POA: Diagnosis not present

## 2022-09-06 DIAGNOSIS — I5022 Chronic systolic (congestive) heart failure: Secondary | ICD-10-CM | POA: Insufficient documentation

## 2022-09-06 DIAGNOSIS — J90 Pleural effusion, not elsewhere classified: Secondary | ICD-10-CM | POA: Diagnosis not present

## 2022-09-06 DIAGNOSIS — I959 Hypotension, unspecified: Secondary | ICD-10-CM | POA: Diagnosis not present

## 2022-09-06 DIAGNOSIS — F1090 Alcohol use, unspecified, uncomplicated: Secondary | ICD-10-CM | POA: Insufficient documentation

## 2022-09-06 DIAGNOSIS — Z720 Tobacco use: Secondary | ICD-10-CM | POA: Diagnosis not present

## 2022-09-06 DIAGNOSIS — R0683 Snoring: Secondary | ICD-10-CM | POA: Diagnosis not present

## 2022-09-06 DIAGNOSIS — F1010 Alcohol abuse, uncomplicated: Secondary | ICD-10-CM | POA: Diagnosis not present

## 2022-09-06 DIAGNOSIS — I952 Hypotension due to drugs: Secondary | ICD-10-CM

## 2022-09-06 MED ORDER — FUROSEMIDE 40 MG PO TABS: 40.0000 mg | ORAL_TABLET | Freq: Every day | ORAL | 5 refills | Status: DC

## 2022-09-06 MED ORDER — POTASSIUM CHLORIDE CRYS ER 20 MEQ PO TBCR: 20.0000 meq | EXTENDED_RELEASE_TABLET | Freq: Every day | ORAL | 5 refills | Status: DC

## 2022-09-06 MED ORDER — EMPAGLIFLOZIN 10 MG PO TABS: 10.0000 mg | ORAL_TABLET | Freq: Every day | ORAL | 5 refills | Status: DC

## 2022-09-06 MED ORDER — CARVEDILOL 6.25 MG PO TABS: 6.2500 mg | ORAL_TABLET | Freq: Two times a day (BID) | ORAL | 5 refills | Status: DC

## 2022-09-06 MED ORDER — LOSARTAN POTASSIUM 25 MG PO TABS: 25.0000 mg | ORAL_TABLET | Freq: Every day | ORAL | 5 refills | Status: DC

## 2022-09-06 NOTE — Progress Notes (Signed)
PCP: Pcp, No Cardiologist: [facility] during recent admission  HPI:  [name] is a 42 y/o female with a history of anxiety (2004), bipolar, depression, hyperlipidemia, previous alcohol use, morbid obesity, previous tobacco use and chronic heart failure.   Echo 08/11/52: EF 30-35% along with Grade II DD and mild/moderate MR.   Chest CT 08/08/22: with cardiomegaly, trace bilateral pleural effusions   Admitted 08/08/22 due to SOB, DOE and orthopnea for a week prior. Covid 2023. CTA negative for PE. Initially needed bipap and then transitioned to 4L  She presents today for a HF f/u visit with a chief complaint of minimal fatigue with moderate exertion. She has anxiety, snoring and dizziness along with this. She denies SOB, chest pain, cough, swelling, palpitations or difficulty sleeping. Initially said she was taking furosemide 80mg  BID but then said it was 40mg  BID as the directions on the bottle were unclear. Voices frustration with the low sodium diet and how she doesn't even want to cook anymore because she can't season it with garlic pepper like she used to. Continues to walk daily and has continued to abstain from alcohol. Sleep study has been completed but no results are available yet.   She is a single mom of 82 & 53 y/o daughters. Gets disability due to her mental health. In 2021, she had 4 loved ones die from covid (grandmother, mother, uncle and mother's boyfriend) and she says that after all of that happened, she started smoking 1-1.5 packs of cigarettes at night as well as drink 1/2-1 bottle of tequila at night to numb the pain. Smoke and drank like this for the last 3 years. Denies having any withdrawal symptoms since stoppage a couple of weeks ago.    ROS: All systems negative except as listed in HPI, PMH and Problem List.  SH:  Social History   Socioeconomic History   Marital status: Single    Spouse name: NA   Number of children: 2   Years of education: 11   Highest education level:  GED or equivalent  Occupational History   Occupation: unemployed   Occupation: SSI   Tobacco Use   Smoking status: Every Day    Packs/day: 0.50    Years: 20.00    Additional pack years: 0.00    Total pack years: 10.00    Types: Cigarettes   Smokeless tobacco: Never   Tobacco comments:    denies 2nd smoke  Vaping Use   Vaping Use: Never used  Substance and Sexual Activity   Alcohol use: Yes    Alcohol/week: 2.0 standard drinks of alcohol    Types: 2 Shots of liquor per week    Comment: patient drinks 1-3x per week    Drug use: Not Currently   Sexual activity: Not on file  Other Topics Concern   Not on file  Social History Narrative   Patient is a single mom of two children ages 48 and 58yo. She receives disability due to her mental health diagnoses. She has one close friend and has a supportive friendship with the father of her children. She lost 4 loved ones due to Covid-19 from 04/2019 - 06/2019, including her grandmother, her mother, her uncle and her mother's boyfriend. She also recently suffered a miscarriage.    Social Determinants of Health   Financial Resource Strain: Not on file  Food Insecurity: No Food Insecurity (08/08/2022)   Hunger Vital Sign    Worried About Running Out of Food in the Last Year: Never true  Ran Out of Food in the Last Year: Never true  Transportation Needs: No Transportation Needs (08/08/2022)   PRAPARE - Administrator, Civil Service (Medical): No    Lack of Transportation (Non-Medical): No  Physical Activity: Not on file  Stress: Not on file  Social Connections: Not on file  Intimate Partner Violence: Not At Risk (08/08/2022)   Humiliation, Afraid, Rape, and Kick questionnaire    Fear of Current or Ex-Partner: No    Emotionally Abused: No    Physically Abused: No    Sexually Abused: No    FH:  Family History  Problem Relation Age of Onset   COPD Mother    Heart disease Mother    Drug abuse Mother    Sickle cell trait  Brother     Past Medical History:  Diagnosis Date   Anxiety    Bipolar 1 disorder, depressed (HCC)    CHF (congestive heart failure) (HCC)    Depression    Hyperlipidemia     Current Outpatient Medications  Medication Sig Dispense Refill   aspirin EC 81 MG tablet Take 1 tablet (81 mg total) by mouth daily. Swallow whole. 30 tablet 0   carvedilol (COREG) 6.25 MG tablet Take 1 tablet (6.25 mg total) by mouth 2 (two) times daily with a meal. 60 tablet 0   empagliflozin (JARDIANCE) 10 MG TABS tablet Take 1 tablet (10 mg total) by mouth daily. 30 tablet 0   folic acid (FOLVITE) 1 MG tablet Take 1 tablet (1 mg total) by mouth daily. 30 tablet 0   furosemide (LASIX) 40 MG tablet Take 1 tablet (40 mg total) by mouth daily. Increase to 1 tablet (40 mg total) by mouth TWICE daily (total daily dose 80 mg) as needed for increased leg swelling, shortness of breath, weight gain 5+ lbs over 1-2 days. Seek medical care if these symptoms are not improving with increased dose. (Patient taking differently: Take 40 mg by mouth 2 (two) times daily. Increase to 1 tablet (40 mg total) by mouth TWICE daily (total daily dose 80 mg) as needed for increased leg swelling, shortness of breath, weight gain 5+ lbs over 1-2 days. Seek medical care if these symptoms are not improving with increased dose.) 60 tablet 0   losartan (COZAAR) 25 MG tablet Take 1 tablet (25 mg total) by mouth daily. 30 tablet 0   Multiple Vitamin (MULTIVITAMIN WITH MINERALS) TABS tablet Take 1 tablet by mouth daily.     potassium chloride SA (KLOR-CON M) 20 MEQ tablet Take 1 tablet (20 mEq total) by mouth daily. 30 tablet 0   thiamine (VITAMIN B-1) 100 MG tablet Take 1 tablet (100 mg total) by mouth daily. 30 tablet 0   No current facility-administered medications for this visit.   Vitals:   09/06/22 1309  BP: (!) 82/60  Pulse: 92  SpO2: 100%  Weight: 231 lb (104.8 kg)  Height: 5\' 1"  (1.549 m)   Wt Readings from Last 3 Encounters:   09/06/22 231 lb (104.8 kg)  08/23/22 234 lb 12.8 oz (106.5 kg)  08/10/22 235 lb 4.8 oz (106.7 kg)   Lab Results  Component Value Date   CREATININE 1.14 (H) 08/23/2022   CREATININE 1.05 (H) 08/10/2022   CREATININE 0.89 08/09/2022   PHYSICAL EXAM:  General:  Well appearing. No resp difficulty HEENT: normal Neck: supple. JVP flat. No lymphadenopathy or thryomegaly appreciated. Cor: PMI normal. Regular rate & rhythm. No rubs, gallops or murmurs. Lungs: clear Abdomen: soft,  nontender, nondistended. No hepatosplenomegaly. No bruits or masses.  Extremities: no cyanosis, clubbing, rash, edema Neuro: alert & oriented x3, cranial nerves grossly intact. Moves all 4 extremities w/o difficulty. Affect pleasant.   ECG: not done   ASSESSMENT & PLAN:  1: Chronic heart failure with reduced ejection fraction- - NYHA class II - euvolemic today - etiology could be previous alcohol use or undiagnosed OSA - weighing most days; reminded to call for an overnight weight gain of > 2 pounds or a weekly weight gain of > 5 pounds - weight down 3 pounds from last visit here 2 weeks ago - Echo 08/11/52: EF 30-35% along with Grade II DD and mild/moderate MR.  - Chest CT 08/08/22: with cardiomegaly, trace bilateral pleural effusions  - reviewed 2000mg  sodium diet and that she could probably use garlic pepper to cook with since she was cooking for the family as long as she doesn't add it afterwards to her food which she says that she doesn't do - continue carvedilol 6.25mg  BID - continue jardiance 10mg  daily - hold furosemide for the next 3 days and then on Sunday, resume at 40mg  once daily - continue potassium daily - continue losartan 25mg  daily - cMRI ordered; if EF remains low or develops chest pain, begin ischemic workup with possible cath - refer to HF provider at next visit - walking 1-2 laps at [facility] every day - BNP 08/08/22 was 277.6 - PharmD reconciled meds w/ patient  2: Tobacco/  Alcohol use- - BMP 08/23/22 showed sodium 140, potassium 4.7, creatinine 1.14 & GFR >60 - has not smoked or had any alcohol use since recent admission 08/08/22; had drank heavily for previous 3 years (1/2-1 bottle of tequila nightly) - congratulated her on this and encouraged her to continue abstaining  3: ST- - per previous EKG's, ST has been present since 02/23 [facility] cardiology saw patient during recent admission - on carvedilol 6.25mg  BID  4: Snoring- - boyfriend has commented on her snoring although he says that it is less severe now - home sleep study has been done; results pending  5: Hypotension- - holding furosemide for 3 days and then resume at 40mg  once daily   Return in 1 month, sooner if needed

## 2022-09-06 NOTE — Patient Instructions (Signed)
Do not take anymore furosemide (lasix) until Sunday and then you can resume it by taking 1 tablet in the morning only.    Someone will call you once the cardiac MRI has been scheduled.

## 2022-09-06 NOTE — Progress Notes (Signed)
[facility] FAILURE [facility]IN[facility] - Pharmacist Note  Sabrina Mata is a 42 y.o. female with HFrEF (EF <40%) presenting to the Heart Failure Clinic for follow up. She is somewhat overwhelmed with all of the changes after her diagnosis of HF. 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 She does not have a system for monitoring her fluid intake. She has been checking her weight almost daily and reports no signs or symptoms of volume overload. She reports no adverse events on her current regimen though she is hypotensive today. Patient reports taking furosemide 80 mg BID rather than 40 mg daily as prescribed. The sig on her furosemide bottle was unclear and may have led to some confusion. She reports feeling well and no symptoms of hypotension.  Recent ED Visit (past 6 months):  Date: 08/08/2022, CC: SOB  Guideline-Directed Medical Therapy/Evidence Based Medicine ACE/ARB/ARNI: Losartan 25 mg daily Beta Blocker: Carvedilol 6.25 mg twice daily Aldosterone Antagonist:  none Diuretic: Furosemide 80 mg twice daily SGLT2i: Empagliflozin 10 mg daily  Adherence Assessment Do you ever forget to take your medication? [] Yes [x] No  Do you ever skip doses due to side effects? [] Yes [x] No  Do you have trouble affording your medicines? [] Yes [x] No  Are you ever unable to pick up your medication due to transportation difficulties? [] Yes [x] No  Do you ever stop taking your medications because you don't believe they are helping? [] Yes [x] No  Do you check your weight daily? [x] Yes [] No  Adherence strategy: pill box  Barriers to obtaining medications: none reported  Diagnostics ECHO: Date 08/09/2022, EF 30-35%, GHK, G2DD   Vitals    08/23/2022   10:02 AM 08/10/2022   11:41 AM 08/10/2022    8:06 AM  Vitals with BMI  Weight 234 lbs 13 oz    BMI 44.39    Systolic 105 131 161  Diastolic 63 97 84  Pulse 101 108 85     Recent Labs    Latest Ref Rng & Units 08/23/2022    11:36 AM 08/10/2022    5:52 AM 08/09/2022    6:21 AM  BMP  Glucose 70 - 99 mg/dL 99  87  096   BUN 6 - 20 mg/dL 20  16  13    Creatinine 0.44 - 1.00 mg/dL 0.45  4.09  8.11   Sodium 135 - 145 mmol/L 140  139  139   Potassium 3.5 - 5.1 mmol/L 4.7  4.0  3.5   Chloride 98 - 111 mmol/L 102  103  104   CO2 22 - 32 mmol/L 29  28  27    Calcium 8.9 - 10.3 mg/dL 9.3  8.9  8.8     Past Medical History Past Medical History:  Diagnosis Date   Anxiety    Bipolar 1 disorder, depressed (HCC)    CHF (congestive heart failure) (HCC)    Depression    Hyperlipidemia     Plan Continue regimen as directed by NP Check labs today  Hold furosemide for a few days and resume 40 mg daily starting on Monday 6/3 Addition and titration of GDMT precluded by hypotension Consider switching losartan to Entresto ($11.20 for 30 or 90 day supply) Titrate carvedilol and add MRA as BP allows Consider switch from carvedilol to metoprolol for more targeted HR control with reduced effect on BP Annual echo due 08/2023  Time spent: 15 minutes  Celene Squibb, PharmD PGY1 Pharmacy Resident 09/06/2022 1:19 PM

## 2022-09-07 ENCOUNTER — Encounter: Payer: Self-pay | Admitting: Family

## 2022-09-11 ENCOUNTER — Inpatient Hospital Stay (HOSPITAL_COMMUNITY)
Admission: RE | Admit: 2022-09-11 | Discharge: 2022-09-11 | Disposition: A | Discharge status: Home / Self Care | Payer: Medicare Other | Source: Ambulatory Visit | Attending: Family | Admitting: Family

## 2022-09-11 ENCOUNTER — Ambulatory Visit: Payer: Medicare Other | Attending: Family

## 2022-09-11 ENCOUNTER — Other Ambulatory Visit: Payer: Self-pay

## 2022-09-11 ENCOUNTER — Other Ambulatory Visit (HOSPITAL_COMMUNITY): Payer: Self-pay | Admitting: Family

## 2022-09-11 DIAGNOSIS — I5042 Chronic combined systolic (congestive) and diastolic (congestive) heart failure: Secondary | ICD-10-CM

## 2022-09-11 DIAGNOSIS — I5022 Chronic systolic (congestive) heart failure: Secondary | ICD-10-CM

## 2022-09-11 NOTE — Progress Notes (Signed)
14 day Zio home monitor order placed, per Clarisa Kindred, FNP. Jolene Schimke, CMA, spoke to patient by phone to explain and review monitor instructions with patient.

## 2022-09-11 NOTE — Procedures (Signed)
SLEEP STUDY REPORT Patient Information Study Date: 08/29/2022 Patient Name: Sabrina Mata Patient ID: [id number] Birth Date: November 19, 1980 Age: 42 Gender: Female BMI: 44.1 (W=233 lb, H=5' 1'') Stopbang: 4 Referring Physician: Clarisa Kindred, FNP  TEST DESCRIPTION:  Home sleep apnea testing was completed using the WatchPat, a Type 1 device, utilizing peripheral arterial tonometry (PAT), chest movement, actigraphy, pulse oximetry, pulse rate, body position and snore.  AHI was calculated with apnea and hypopnea using valid sleep time as the denominator. RDI includes apneas, hypopneas, and RERAs.  The data acquired and the scoring of sleep and all associated events were performed in accordance with the recommended standards and specifications as outlined in the AASM Manual for the Scoring of Sleep and Associated Events 2.2.0 (2015).  FINDINGS:  1.  Moderate Obstructive Sleep Apnea with AHI 16.4/hr.   2.  No Central Sleep Apnea with pAHIc 0.8/hr.  3.  Oxygen desaturations as low as 87%.  4.  Mild to moderate snoring was present. O2 sats were < 88% for 0.3 min.  5.  Total sleep time was 5 hrs and 37 min.  6.  22.3% of total sleep time was spent in REM sleep.   7.  Normal sleep onset latency at 18 min  8.  Normal REM sleep onset latency at 91 min.   9.  Total awakenings were 27.  10. Arrhythmia detection:  Suggestive of possible brief atrial fibrillation lasting 27 seconds.  This is not diagnostic and further testing with outpatient telemetry monitoring is recommended.  DIAGNOSIS:   Moderate Obstructive Sleep Apnea (G47.33) Possible Atrial Fibrillation  RECOMMENDATIONS:   1.  Clinical correlation of these findings is necessary.  The decision to treat obstructive sleep apnea (OSA) is usually based on the presence of apnea symptoms or the presence of associated medical conditions such as Hypertension, Congestive Heart Failure, Atrial Fibrillation or Obesity.  The most common symptoms  of OSA are snoring, gasping for breath while sleeping, daytime sleepiness and fatigue.   2.  Initiating apnea therapy is recommended given the presence of symptoms and/or associated conditions. Recommend proceeding with one of the following:     a.  Auto-CPAP therapy with a pressure range of 5-20cm H2O.     b.  An oral appliance (OA) that can be obtained from certain dentists with expertise in sleep medicine.  These are primarily of use in non-obese patients with mild and moderate disease.     c.  An ENT consultation which may be useful to look for specific causes of obstruction and possible treatment options.     d.  If patient is intolerant to PAP therapy, consider referral to ENT for evaluation for hypoglossal nerve stimulator.   3.  Close follow-up is necessary to ensure success with CPAP or oral appliance therapy for maximum benefit.  4.  A follow-up oximetry study on CPAP is recommended to assess the adequacy of therapy and determine the need for supplemental oxygen or the potential need for Bi-level therapy.  An arterial blood gas to determine the adequacy of baseline ventilation and oxygenation should also be considered.  5.  Healthy sleep recommendations include:  adequate nightly sleep (normal 7-9 hrs/night), avoidance of caffeine after noon and alcohol near bedtime, and maintaining a sleep environment that is cool, dark and quiet.  6.  Weight loss for overweight patients is recommended.  Even modest amounts of weight loss can significantly improve the severity of sleep apnea.  7.  Snoring recommendations include:  weight loss  where appropriate, side sleeping, and avoidance of alcohol before bed.  8.  Operation of motor vehicle should not be performed when sleepy.  Signature: Armanda Magic, MD; [facility]; Diplomat, American Board of Sleep Medicine Electronically Signed: 09/11/2022 9:04:08 AM

## 2022-09-18 ENCOUNTER — Telehealth: Payer: Self-pay

## 2022-09-18 NOTE — Telephone Encounter (Signed)
Called pt to follow up on her zio patch placement.  Pt stated that she has been at the pool and will not be able to place it until after this weekend. Nurse explained to pt the importance of having her Zio patch placed. Education given with teach back. Pt stated that she will call clinic Monday when Zio patch has been placed and activated.  will route to Clarisa Kindred, FNP for FYI.

## 2022-09-20 ENCOUNTER — Telehealth: Payer: Self-pay | Admitting: *Deleted

## 2022-09-20 DIAGNOSIS — G4733 Obstructive sleep apnea (adult) (pediatric): Secondary | ICD-10-CM

## 2022-09-20 DIAGNOSIS — I5022 Chronic systolic (congestive) heart failure: Secondary | ICD-10-CM

## 2022-09-20 DIAGNOSIS — R0683 Snoring: Secondary | ICD-10-CM

## 2022-09-20 NOTE — Telephone Encounter (Signed)
-----   Message from Quintella Reichert, MD sent at 09/11/2022  9:06 AM EDT ----- Please let patient know that they have sleep apnea and recommend treating with CPAP.  Please order an auto CPAP from 4-15cm H2O with heated humidity and mask of choice.  Order overnight pulse ox on CPAP.  Followup with me in 6 weeks.

## 2022-09-20 NOTE — Telephone Encounter (Signed)
The patient has been notified of the result and verbalized understanding.  All questions (if any) were answered. Sabrina Mata, CMA 09/20/2022 5:01 PM    Upon patient request DME selection is [facility]. Patient understands he will be contacted by [facility] to set up his cpap. Patient understands to call if [facility] does not contact him with new setup in a timely manner. Patient understands they will be called once confirmation has been received from [facility]/ that they have received their new machine to schedule 10 week follow up appointment.   [facility] notified of new cpap order  Please add to airview Patient was grateful for the call and thanked me.

## 2022-09-24 ENCOUNTER — Telehealth: Payer: Self-pay

## 2022-09-24 NOTE — Telephone Encounter (Signed)
Pt came this morning to clinic to have her Zio AT Patch placed by this RN. Zio patch placed onto patient.  All instructions and information reviewed with patient, she verbalized understanding with no questions.  Zio # :   [id number]

## 2022-10-02 NOTE — Addendum Note (Signed)
Encounter addended by: Crissie Figures, RN on: 10/02/2022 3:06 PM  Actions taken: Imaging Exam begun

## 2022-10-02 NOTE — Addendum Note (Signed)
Encounter addended by: Crissie Figures, RN on: 10/02/2022 3:07 PM  Actions taken: Imaging Exam begun

## 2022-10-02 NOTE — Progress Notes (Signed)
PCP: Pcp, No Cardiologist: [facility] during recent admission  HPI:  Sabrina Mata is a 42 y/o female with a history of anxiety (2004), bipolar, depression, hyperlipidemia, previous alcohol use, morbid obesity, OSA, previous tobacco use and chronic heart failure.   Echo 08/09/22: EF 30-35% along with Grade II DD and mild/moderate MR.   Chest CT 08/08/22: with cardiomegaly, trace bilateral pleural effusions   Admitted 08/08/22 due to SOB, DOE and orthopnea for a week prior. Covid 2023. CTA negative for PE. Initially needed bipap and then transitioned to 4L  She presents today for a HF f/u visit with a chief complaint of minimal fatigue with moderate exertion. Chronic in nature. Occasional chest pain when she is stressed or fussing at her kids. Denies SOB, palpitations, dizziness, edema, weight gain or difficulty sleeping. Continues to walk at the track every evening. Overall is feeling better and is less stressed about how to cook her foods. She has not taken her medications yet today because she hasn't eaten any breakfast.   Says that she's picking up her CPAP equipment today.   Still has zio monitor on until 10/08/22. Has cMRI scheduled on 10/10/22.  She is a single mom of 74 & 24 y/o daughters. Gets disability due to her mental health. In 2021, she had 4 loved ones die from covid (grandmother, mother, uncle and mother's boyfriend) and she says that after all of that happened, she started smoking 1-1.5 packs of cigarettes at night as well as drink 1/2-1 bottle of tequila at night to numb the pain. Smoke and drank like this for the last 3 years. Denies having any withdrawal symptoms since stoppage during 05/24 admission.   ROS: All systems negative except as listed in HPI, PMH and Problem List.  SH:  Social History   Socioeconomic History   Marital status: Single    Spouse name: NA   Number of children: 2   Years of education: 11   Highest education level: GED or equivalent  Occupational History    Occupation: unemployed   Occupation: SSI   Tobacco Use   Smoking status: Every Day    Packs/day: 0.50    Years: 20.00    Additional pack years: 0.00    Total pack years: 10.00    Types: Cigarettes   Smokeless tobacco: Never   Tobacco comments:    denies 2nd smoke  Vaping Use   Vaping Use: Never used  Substance and Sexual Activity   Alcohol use: Yes    Alcohol/week: 2.0 standard drinks of alcohol    Types: 2 Shots of liquor per week    Comment: patient drinks 1-3x per week    Drug use: Not Currently   Sexual activity: Not on file  Other Topics Concern   Not on file  Social History Narrative   Patient is a single mom of two children ages 32 and 71yo. She receives disability due to her mental health diagnoses. She has one close friend and has a supportive friendship with the father of her children. She lost 4 loved ones due to Covid-19 from 04/2019 - 06/2019, including her grandmother, her mother, her uncle and her mother's boyfriend. She also recently suffered a miscarriage.    Social Determinants of Health   Financial Resource Strain: Not on file  Food Insecurity: No Food Insecurity (08/08/2022)   Hunger Vital Sign    Worried About Running Out of Food in the Last Year: Never true    Ran Out of Food in the Last  Year: Never true  Transportation Needs: No Transportation Needs (08/08/2022)   PRAPARE - Administrator, Civil Service (Medical): No    Lack of Transportation (Non-Medical): No  Physical Activity: Not on file  Stress: Not on file  Social Connections: Not on file  Intimate Partner Violence: Not At Risk (08/08/2022)   Humiliation, Afraid, Rape, and Kick questionnaire    Fear of Current or Ex-Partner: No    Emotionally Abused: No    Physically Abused: No    Sexually Abused: No    FH:  Family History  Problem Relation Age of Onset   COPD Mother    Heart disease Mother    Drug abuse Mother    Sickle cell trait Brother     Past Medical History:  Diagnosis  Date   Anxiety    Bipolar 1 disorder, depressed (HCC)    CHF (congestive heart failure) (HCC)    Depression    Hyperlipidemia     Current Outpatient Medications  Medication Sig Dispense Refill   aspirin EC 81 MG tablet Take 1 tablet (81 mg total) by mouth daily. Swallow whole. 30 tablet 0   carvedilol (COREG) 6.25 MG tablet Take 1 tablet (6.25 mg total) by mouth 2 (two) times daily with a meal. 60 tablet 5   empagliflozin (JARDIANCE) 10 MG TABS tablet Take 1 tablet (10 mg total) by mouth daily. 30 tablet 5   folic acid (FOLVITE) 1 MG tablet Take 1 tablet (1 mg total) by mouth daily. 30 tablet 0   furosemide (LASIX) 40 MG tablet Take 1 tablet (40 mg total) by mouth daily. 30 tablet 5   losartan (COZAAR) 25 MG tablet Take 1 tablet (25 mg total) by mouth daily. 30 tablet 5   Multiple Vitamin (MULTIVITAMIN WITH MINERALS) TABS tablet Take 1 tablet by mouth daily.     potassium chloride SA (KLOR-CON M) 20 MEQ tablet Take 1 tablet (20 mEq total) by mouth daily. 30 tablet 5   thiamine (VITAMIN B-1) 100 MG tablet Take 1 tablet (100 mg total) by mouth daily. 30 tablet 0   No current facility-administered medications for this visit.   Vitals:   10/03/22 1001  BP: 124/85  Pulse: 92  SpO2: 99%  Weight: 231 lb (104.8 kg)   Wt Readings from Last 3 Encounters:  10/03/22 231 lb (104.8 kg)  09/06/22 231 lb (104.8 kg)  08/23/22 234 lb 12.8 oz (106.5 kg)   Lab Results  Component Value Date   CREATININE 1.14 (H) 08/23/2022   CREATININE 1.05 (H) 08/10/2022   CREATININE 0.89 08/09/2022   PHYSICAL EXAM:  General:  Well appearing. No resp difficulty HEENT: normal Neck: supple. JVP flat. No lymphadenopathy or thryomegaly appreciated. Cor: PMI normal. Regular rate & rhythm. No rubs, gallops or murmurs. Lungs: clear Abdomen: soft, nontender, nondistended. No hepatosplenomegaly. No bruits or masses.  Extremities: no cyanosis, clubbing, rash, edema Neuro: alert & oriented x3, cranial nerves  grossly intact. Moves all 4 extremities w/o difficulty. Affect pleasant, much more relaxed.   ECG: not done   ASSESSMENT & PLAN:  1: Chronic heart failure with reduced ejection fraction- - NYHA class II - euvolemic today - etiology could be previous alcohol use or untreated OSA - weighing most days; reminded to call for an overnight weight gain of > 2 pounds or a weekly weight gain of > 5 pounds - weight unchanged from last visit here 1 month ago - Echo 08/11/52: EF 30-35% along with Grade II DD  and mild/moderate MR.  - Chest CT 08/08/22: with cardiomegaly, trace bilateral pleural effusions  - cMRI scheduled for 10/10/22; if EF remains low or develops chest pain, begin ischemic workup with possible cath - reviewed 2000mg  sodium diet; she reports doing much better with this - continue carvedilol 6.25mg  BID - continue jardiance 10mg  daily - continue furosemide 40mg  daily - continue potassium daily - continue losartan 25mg  daily; consider transitioning to entresto but last visit she was quite hypotensive and today, she hasn't taken any of her meds yet - will see HF provider at next visit after cMRI - walking 1-2 laps at at the track every day - BNP 08/08/22 was 277.6  2: Tobacco/ Alcohol use- - BMP 08/23/22 showed sodium 140, potassium 4.7, creatinine 1.14 & GFR >60 - BMP today - has not smoked or had any alcohol use since recent admission 08/08/22; had drank heavily for previous 3 years (1/2-1 bottle of tequila nightly) - congratulated her on this and encouraged her to continue abstaining  3: ST- - per previous EKG's, ST has been present since 02/23 [facility] cardiology saw patient during recent admission in 05/24 - continue carvedilol 6.25mg  BID; may titrate up at next visit - currently wearing zio monitor; to be removed 10/08/22  4: OSA- - boyfriend has commented on her snoring  - home sleep study has been done - says that she's going to pick up CPAP equipment later today  Return  in 1 month, sooner if needed.

## 2022-10-03 ENCOUNTER — Other Ambulatory Visit
Admission: RE | Admit: 2022-10-03 | Discharge: 2022-10-03 | Disposition: A | Discharge status: Home / Self Care | Payer: Medicare Other | Source: Ambulatory Visit | Attending: Family | Admitting: Family

## 2022-10-03 ENCOUNTER — Ambulatory Visit (HOSPITAL_BASED_OUTPATIENT_CLINIC_OR_DEPARTMENT_OTHER): Payer: Medicare Other | Admitting: Family

## 2022-10-03 ENCOUNTER — Encounter: Payer: Self-pay | Admitting: Family

## 2022-10-03 VITALS — BP 124/85 | HR 92 | Wt 231.0 lb

## 2022-10-03 DIAGNOSIS — F1010 Alcohol abuse, uncomplicated: Secondary | ICD-10-CM | POA: Diagnosis not present

## 2022-10-03 DIAGNOSIS — I5022 Chronic systolic (congestive) heart failure: Secondary | ICD-10-CM

## 2022-10-03 DIAGNOSIS — R000 Tachycardia, unspecified: Secondary | ICD-10-CM

## 2022-10-03 DIAGNOSIS — G4733 Obstructive sleep apnea (adult) (pediatric): Secondary | ICD-10-CM | POA: Diagnosis not present

## 2022-10-03 LAB — BASIC METABOLIC PANEL
Anion gap: 10 (ref 5–15)
BUN: 14 mg/dL (ref 6–20)
CO2: 23 mmol/L (ref 22–32)
Calcium: 8.8 mg/dL — ABNORMAL LOW (ref 8.9–10.3)
Chloride: 104 mmol/L (ref 98–111)
Creatinine, Ser: 1.03 mg/dL — ABNORMAL HIGH (ref 0.44–1.00)
GFR, Estimated: >60 mL/min (ref >60)
Glucose, Bld: 107 mg/dL — ABNORMAL HIGH (ref 70–99)
Potassium: 3.6 mmol/L (ref 3.5–5.1)
Sodium: 137 mmol/L (ref 135–145)

## 2022-10-03 NOTE — Patient Instructions (Signed)
Go to the medical mall to get your lab work drawn.

## 2022-10-08 ENCOUNTER — Encounter (HOSPITAL_COMMUNITY): Payer: Self-pay

## 2022-10-09 ENCOUNTER — Telehealth (HOSPITAL_COMMUNITY): Payer: Self-pay | Admitting: *Deleted

## 2022-10-09 NOTE — Telephone Encounter (Signed)
Reaching out to patient to offer assistance regarding upcoming cardiac imaging study; pt verbalizes understanding of appt date/time, parking situation and where to check in, and verified current allergies; name and call back number provided for further questions should they arise  Chanele Douglas RN Navigator Cardiac Imaging [facility] [phone] office [phone] cell  Patient reports having an MRI in the past without incident. 

## 2022-10-10 ENCOUNTER — Ambulatory Visit
Admission: RE | Admit: 2022-10-10 | Discharge: 2022-10-10 | Disposition: A | Discharge status: Home / Self Care | Payer: Medicare Other | Source: Ambulatory Visit | Attending: Family | Admitting: Family

## 2022-10-10 ENCOUNTER — Other Ambulatory Visit: Payer: Self-pay | Admitting: Family

## 2022-10-10 DIAGNOSIS — I5022 Chronic systolic (congestive) heart failure: Secondary | ICD-10-CM | POA: Diagnosis not present

## 2022-10-10 MED ORDER — GADOBUTROL 1 MMOL/ML IV SOLN
13.0000 mL | Freq: Once | INTRAVENOUS | Status: AC | PRN
  Administered 2022-10-10: 13 mL via INTRAVENOUS

## 2022-10-15 NOTE — Addendum Note (Signed)
Encounter addended by: Crissie Figures, RN on: 10/15/2022 1:09 PM  Actions taken: Imaging Exam ended

## 2022-10-30 ENCOUNTER — Other Ambulatory Visit
Admission: RE | Admit: 2022-10-30 | Discharge: 2022-10-30 | Disposition: A | Discharge status: Home / Self Care | Payer: Medicare Other | Source: Ambulatory Visit | Attending: Cardiology | Admitting: Cardiology

## 2022-10-30 ENCOUNTER — Encounter: Payer: Self-pay | Admitting: Cardiology

## 2022-10-30 ENCOUNTER — Ambulatory Visit (HOSPITAL_BASED_OUTPATIENT_CLINIC_OR_DEPARTMENT_OTHER): Payer: Medicare Other | Admitting: Cardiology

## 2022-10-30 VITALS — BP 108/68 | HR 90 | Resp 14 | Wt 228.2 lb

## 2022-10-30 DIAGNOSIS — I10 Essential (primary) hypertension: Secondary | ICD-10-CM | POA: Diagnosis not present

## 2022-10-30 DIAGNOSIS — G4733 Obstructive sleep apnea (adult) (pediatric): Secondary | ICD-10-CM | POA: Diagnosis not present

## 2022-10-30 DIAGNOSIS — I5042 Chronic combined systolic (congestive) and diastolic (congestive) heart failure: Secondary | ICD-10-CM

## 2022-10-30 DIAGNOSIS — F1010 Alcohol abuse, uncomplicated: Secondary | ICD-10-CM | POA: Diagnosis not present

## 2022-10-30 LAB — BASIC METABOLIC PANEL
Anion gap: 8 (ref 5–15)
BUN: 16 mg/dL (ref 6–20)
CO2: 25 mmol/L (ref 22–32)
Calcium: 8.9 mg/dL (ref 8.9–10.3)
Chloride: 104 mmol/L (ref 98–111)
Creatinine, Ser: 1.08 mg/dL — ABNORMAL HIGH (ref 0.44–1.00)
GFR, Estimated: >60 mL/min (ref >60)
Glucose, Bld: 98 mg/dL (ref 70–99)
Potassium: 3.8 mmol/L (ref 3.5–5.1)
Sodium: 137 mmol/L (ref 135–145)

## 2022-10-30 LAB — BRAIN NATRIURETIC PEPTIDE: B Natriuretic Peptide: 64.8 pg/mL (ref 0.0–100.0)

## 2022-10-30 MED ORDER — CARVEDILOL 12.5 MG PO TABS: 12.5000 mg | ORAL_TABLET | Freq: Two times a day (BID) | ORAL | 3 refills | Status: DC

## 2022-10-30 MED ORDER — SPIRONOLACTONE 25 MG PO TABS: 12.5000 mg | ORAL_TABLET | Freq: Every day | ORAL | 5 refills | Status: DC

## 2022-10-30 NOTE — Patient Instructions (Signed)
Medication Changes:  START Spironolactone 12.5 MG once daily STOP Potassium chloride 20 MEQ INCREASE Carvedilol (Coreg) to 12.5 mg tablet twice daily (New Prescription Sent)   Lab Work:  Labs done today, your results will be available in MyChart, we will contact you for abnormal readings.   Referrals:  You have been referred to Cardiac Rehabilitation. They will call you to schedule your appointment.   Special Instructions // Education:  Do the following things EVERYDAY: Weigh yourself in the morning before breakfast. Write it down and keep it in a log. Take your medicines as prescribed Eat low salt foods--Limit salt (sodium) to 2000 mg per day.  Stay as active as you can everyday Limit all fluids for the day to less than 2 liters   Follow-Up in: 1 month with Dr. Gasper Lloyd    If you have any questions or concerns before your next appointment please send [place] a message through [facility] or call our office at [phone] Monday-Friday 8 am-5 pm.   If you have an urgent need after hours on the weekend please call your Primary Cardiologist or the [facility] in [place] at [phone].

## 2022-10-30 NOTE — Progress Notes (Addendum)
ADVANCED HEART FAILURE CLINIC NOTE  Referring Physician: No ref. provider found  Primary Care: Pcp, No HF: Dr. Gasper Lloyd  HPI: Sabrina Mata is a 42 y.o. female with heart failure with reduced ejection fraction presenting today to establish care.  Her cardiac history dates back to May 2024 when she presented to [facility] with complaints of shortness of breath over the preceding few weeks.  During admission she had an echocardiogram that demonstrated LVEF of 30 to 35% with moderate eccentric mitral regurgitation followed by a cardiac MRI that was consistent with nonischemic but no infiltrative cardiomyopathy.  She was started on low-dose GDMT and discharged home.  Otherwise no significant family history of heart disease.  She is a single mother with 2 daughters that are 16/17 year olds.  She currently receives disability due to mental health.  Due to depression around multiple deaths in her family she was drinking very heavily over the past 1 year (up to 1 bottle of tequila nightly with 1 to 1-1/2 pack of cigarettes)  Since that time she has followed with [facility] where GDMT was further uptitrated. Since diagnosis of her heart failure she has become very compliant with medications. She is no longer smoking or drinking alcohol.  Her functional status is slowly improving.  She now is compliant with the CPAP every night and reports that her sleep is also better.  She can perform all ADLs without difficulty.  Can walk greater than 50 feet without significant shortness of breath.  She remains extremely anxious about her diagnosis and frequently panics with any reminder of her underlying heart failure.   Past Medical History:  Diagnosis Date   Anxiety    Bipolar 1 disorder, depressed (HCC)    CHF (congestive heart failure) (HCC)    Depression    Hyperlipidemia     Current Outpatient Medications  Medication Sig Dispense Refill   carvedilol (COREG) 6.25 MG tablet Take 1 tablet (6.25 mg  total) by mouth 2 (two) times daily with a meal. 60 tablet 5   empagliflozin (JARDIANCE) 10 MG TABS tablet Take 1 tablet (10 mg total) by mouth daily. 30 tablet 5   furosemide (LASIX) 40 MG tablet Take 1 tablet (40 mg total) by mouth daily. 30 tablet 5   losartan (COZAAR) 25 MG tablet Take 1 tablet (25 mg total) by mouth daily. 30 tablet 5   Multiple Vitamin (MULTIVITAMIN WITH MINERALS) TABS tablet Take 1 tablet by mouth daily.     potassium chloride SA (KLOR-CON M) 20 MEQ tablet Take 1 tablet (20 mEq total) by mouth daily. 30 tablet 5   thiamine (VITAMIN B-1) 100 MG tablet Take 1 tablet (100 mg total) by mouth daily. 30 tablet 0   No current facility-administered medications for this visit.    No Known Allergies    Social History   Socioeconomic History   Marital status: Single    Spouse name: NA   Number of children: 2   Years of education: 11   Highest education level: GED or equivalent  Occupational History   Occupation: unemployed   Occupation: SSI   Tobacco Use   Smoking status: Former    Current packs/day: 0.50    Average packs/day: 0.5 packs/day for 20.0 years (10.0 ttl pk-yrs)    Types: Cigarettes   Smokeless tobacco: Never   Tobacco comments:    denies 2nd smoke  Vaping Use   Vaping status: Never Used  Substance and Sexual Activity   Alcohol use: Yes  Alcohol/week: 2.0 standard drinks of alcohol    Types: 2 Shots of liquor per week    Comment: patient drinks 1-3x per week    Drug use: Not Currently   Sexual activity: Not on file  Other Topics Concern   Not on file  Social History Narrative   Patient is a single mom of two children ages 22 and 81yo. She receives disability due to her mental health diagnoses. She has one close friend and has a supportive friendship with the father of her children. She lost 4 loved ones due to Covid-19 from 04/2019 - 06/2019, including her grandmother, her mother, her uncle and her mother's boyfriend. She also recently suffered a  miscarriage.    Social Determinants of Health   Financial Resource Strain: Not on file  Food Insecurity: No Food Insecurity (08/08/2022)   Hunger Vital Sign    Worried About Running Out of Food in the Last Year: Never true    Ran Out of Food in the Last Year: Never true  Transportation Needs: No Transportation Needs (08/08/2022)   PRAPARE - Administrator, Civil Service (Medical): No    Lack of Transportation (Non-Medical): No  Physical Activity: Not on file  Stress: Not on file  Social Connections: Not on file  Intimate Partner Violence: Not At Risk (08/08/2022)   Humiliation, Afraid, Rape, and Kick questionnaire    Fear of Current or Ex-Partner: No    Emotionally Abused: No    Physically Abused: No    Sexually Abused: No      Family History  Problem Relation Age of Onset   COPD Mother    Heart disease Mother    Drug abuse Mother    Sickle cell trait Brother     PHYSICAL EXAM: Vitals:   10/30/22 1012  BP: 108/68  Pulse: 90  Resp: 14  SpO2: 98%   GENERAL: Well nourished, well developed, and in no apparent distress at rest.  HEENT: Negative for arcus senilis or xanthelasma. There is no scleral icterus.  The mucous membranes are pink and moist.   NECK: Supple, No masses. Normal carotid upstrokes without bruits. No masses or thyromegaly.    CHEST: There are no chest wall deformities. There is no chest wall tenderness. Respirations are unlabored.  Lungs-CTA bilaterally CARDIAC:  JVP: 7 cm H2O         Normal S1, S2  Normal rate with regular rhythm. No murmurs, rubs or gallops.  Pulses are 2+ and symmetrical in upper and lower extremities.  No edema.  ABDOMEN: Soft, non-tender, non-distended. There are no masses or hepatomegaly. There are normal bowel sounds.  EXTREMITIES: Warm and well perfused with no cyanosis, clubbing.  LYMPHATIC: No axillary or supraclavicular lymphadenopathy.  NEUROLOGIC: Patient is oriented x3 with no focal or lateralizing neurologic deficits.   PSYCH: Patients affect is appropriate, there is no evidence of anxiety or depression.  SKIN: Warm and dry; no lesions or wounds.   DATA REVIEW  ECG: 10/30/22: sinus tachycardia  as per my personal interpretation  ECHO: 10/30/22: LVEF 30-35%, Grade II DD,  moderate eccentric MR as per my personal interpretation   CMR 10/10/22: Normal LV size, moderately reduced systolic function.  LVEF 32%.  2.  No LV LGE/scar noted.  3.  No evidence for myocardial infiltration.  4.  Normal RV size and function.  5.  No significant valvular abnormalities.  6.  Etiology for cardiomyopathy appears non ischemic.     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  Plan on coronary CTA to rule out ischemic heart disease once she is better rate controlled NYHA class / AHA Stage:NYHA II Volume status & Diuretics: lasix 40mg  daily; takes extra 40mg  PRN at night.  Vasodilators:continue losartan 25mg  daily Beta-Blocker:increase coreg to 12.5mg  BID XLK:GMWNU spironolactone 12.5mg  daily Cardiometabolic:jardiance 10mg  daily Devices therapies & Valvulopathies: Not currently indicated Advanced therapies: Not indicated  2.  Hypertension -Now well-controlled  3. OSA - Using CPAP nightly now.   4.  History of alcohol/tobacco use -Was previously drinking up to 1 bottle of tequila a day due to multiple stressors.  She is now completely quit.  Also has stopped smoking.  We discussed the importance of continued cessation at length.   Taner Rzepka Advanced Heart Failure Mechanical Circulatory Support

## 2022-11-19 NOTE — Progress Notes (Unsigned)
PCP: Pcp, No Cardiologist: [facility] during recent admission HF provider: Dorthula Nettles, DO (last seen 07/24)  HPI:  [name] is a 42 y/o female with a history of anxiety (2004), bipolar, depression, hyperlipidemia, previous alcohol use, morbid obesity, OSA, previous tobacco use and chronic heart failure.   Admitted 08/08/22 due to SOB, DOE and orthopnea for a week prior. Covid 2023. CTA negative for PE. Initially needed bipap and then transitioned to 4L  Echo 08/09/22: EF 30-35% along with Grade II DD and mild/moderate MR.   Chest CT 08/08/22: with cardiomegaly, trace bilateral pleural effusions   cMRI: 10/10/22: Normal LV size, moderately reduced systolic function.  LVEF 32%.  2.  No LV LGE/scar noted.  3.  No evidence for myocardial infiltration.  4.  Normal RV size and function.  5.  No significant valvular abnormalities.  6.  Etiology for cardiomyopathy appears non ischemic.  She presents today for a HF f/u visit with a chief complaint of  Says that she's picking up her CPAP equipment today.   At last visit, carvedilol was increased to 12.5mg  BID and spironolactone 12.5mg  was started   She is a single mom of 33 & 52 y/o daughters. Gets disability due to her mental health. In 2021, she had 4 loved ones die from covid (grandmother, mother, uncle and mother's boyfriend) and she says that after all of that happened, she started smoking 1-1.5 packs of cigarettes at night as well as drink 1/2-1 bottle of tequila at night to numb the pain. Smoke and drank like this for the last 3 years. Denies having any withdrawal symptoms since stoppage during 05/24 admission.   ROS: All systems negative except as listed in HPI, PMH and Problem List.  SH:  Social History   Socioeconomic History   Marital status: Single    Spouse name: NA   Number of children: 2   Years of education: 11   Highest education level: GED or equivalent  Occupational History   Occupation: unemployed   Occupation: SSI    Tobacco Use   Smoking status: Former    Current packs/day: 0.50    Average packs/day: 0.5 packs/day for 20.0 years (10.0 ttl pk-yrs)    Types: Cigarettes   Smokeless tobacco: Never   Tobacco comments:    denies 2nd smoke  Vaping Use   Vaping status: Never Used  Substance and Sexual Activity   Alcohol use: Yes    Alcohol/week: 2.0 standard drinks of alcohol    Types: 2 Shots of liquor per week    Comment: patient drinks 1-3x per week    Drug use: Not Currently   Sexual activity: Not on file  Other Topics Concern   Not on file  Social History Narrative   Patient is a single mom of two children ages 28 and 53yo. She receives disability due to her mental health diagnoses. She has one close friend and has a supportive friendship with the father of her children. She lost 4 loved ones due to Covid-19 from 04/2019 - 06/2019, including her grandmother, her mother, her uncle and her mother's boyfriend. She also recently suffered a miscarriage.    Social Determinants of Health   Financial Resource Strain: Not on file  Food Insecurity: No Food Insecurity (08/08/2022)   Hunger Vital Sign    Worried About Running Out of Food in the Last Year: Never true    Ran Out of Food in the Last Year: Never true  Transportation Needs: No Transportation Needs (08/08/2022)  PRAPARE - Administrator, Civil Service (Medical): No    Lack of Transportation (Non-Medical): No  Physical Activity: Not on file  Stress: Not on file  Social Connections: Not on file  Intimate Partner Violence: Not At Risk (08/08/2022)   Humiliation, Afraid, Rape, and Kick questionnaire    Fear of Current or Ex-Partner: No    Emotionally Abused: No    Physically Abused: No    Sexually Abused: No    FH:  Family History  Problem Relation Age of Onset   COPD Mother    Heart disease Mother    Drug abuse Mother    Sickle cell trait Brother     Past Medical History:  Diagnosis Date   Anxiety    Bipolar 1 disorder,  depressed (HCC)    CHF (congestive heart failure) (HCC)    Depression    Hyperlipidemia     Current Outpatient Medications  Medication Sig Dispense Refill   carvedilol (COREG) 12.5 MG tablet Take 1 tablet (12.5 mg total) by mouth 2 (two) times daily. 180 tablet 3   empagliflozin (JARDIANCE) 10 MG TABS tablet Take 1 tablet (10 mg total) by mouth daily. 30 tablet 5   furosemide (LASIX) 40 MG tablet Take 1 tablet (40 mg total) by mouth daily. 30 tablet 5   losartan (COZAAR) 25 MG tablet Take 1 tablet (25 mg total) by mouth daily. 30 tablet 5   Multiple Vitamin (MULTIVITAMIN WITH MINERALS) TABS tablet Take 1 tablet by mouth daily.     spironolactone (ALDACTONE) 25 MG tablet Take 0.5 tablets (12.5 mg total) by mouth daily. 45 tablet 5   thiamine (VITAMIN B-1) 100 MG tablet Take 1 tablet (100 mg total) by mouth daily. 30 tablet 0   No current facility-administered medications for this visit.     PHYSICAL EXAM:  General:  Well appearing. No resp difficulty HEENT: normal Neck: supple. JVP flat. No lymphadenopathy or thryomegaly appreciated. Cor: PMI normal. Regular rate & rhythm. No rubs, gallops or murmurs. Lungs: clear Abdomen: soft, nontender, nondistended. No hepatosplenomegaly. No bruits or masses.  Extremities: no cyanosis, clubbing, rash, edema Neuro: alert & oriented x3, cranial nerves grossly intact. Moves all 4 extremities w/o difficulty. Affect pleasant, much more relaxed.   ECG: not done   ASSESSMENT & PLAN:  1: NICM with reduced ejection fraction- - suspect due to alcohol use or untreated OSA - NYHA class II - euvolemic today - weighing most days; reminded to call for an overnight weight gain of > 2 pounds or a weekly weight gain of > 5 pounds - weight 228.4 from last visit here 3 weeks ago - Echo 08/11/52: EF 30-35% along with Grade II DD and mild/moderate MR.  - Chest CT 08/08/22: with cardiomegaly, trace bilateral pleural effusions  cMRI: 10/10/22: Normal LV size,  moderately reduced systolic function.  LVEF 32%.  2.  No LV LGE/scar noted.  3.  No evidence for myocardial infiltration.  4.  Normal RV size and function.  5.  No significant valvular abnormalities.  6.  Etiology for cardiomyopathy appears non ischemic. - plan on coronary CTA to rule out ischemic heart disease once she is better rate controlled - reviewed 2000mg  sodium diet; she reports doing much better with this - continue carvedilol 12.5mg  BID - continue jardiance 10mg  daily - continue furosemide 40mg  daily with additional 40mg  PRN - continue potassium daily - continue losartan 25mg  daily - continue spironolactone 12.5mg  daily - BMP today - walking 1-2  laps at at the track every day - BNP 08/08/22 was 277.6  2: Tobacco/ Alcohol use- - BMP 08/23/22 showed sodium 140, potassium 4.7, creatinine 1.14 & GFR >60 - BMP today - has not smoked or had any alcohol use since recent admission 08/08/22; had drank heavily for previous 3 years (1/2-1 bottle of tequila nightly) - congratulated her on this and encouraged her to continue abstaining  3: ST- - per previous EKG's, ST has been present since 02/23 [facility] cardiology saw patient during recent admission in 05/24 - continue carvedilol 12.5mg  BID - zio 06/24 was sinus rhythm with ave HR of 81 bpm; rare PACs and PVCs  4: OSA- - wearing CPAP nightly

## 2022-11-20 ENCOUNTER — Ambulatory Visit: Payer: Medicare Other | Attending: Cardiology | Admitting: Family

## 2022-11-20 ENCOUNTER — Encounter: Payer: Self-pay | Admitting: Family

## 2022-11-20 ENCOUNTER — Encounter: Payer: Medicare Other | Admitting: Cardiology

## 2022-11-20 VITALS — BP 93/64 | HR 83 | Resp 14 | Wt 228.0 lb

## 2022-11-20 DIAGNOSIS — I428 Other cardiomyopathies: Secondary | ICD-10-CM | POA: Insufficient documentation

## 2022-11-20 DIAGNOSIS — R000 Tachycardia, unspecified: Secondary | ICD-10-CM | POA: Diagnosis not present

## 2022-11-20 DIAGNOSIS — I5022 Chronic systolic (congestive) heart failure: Secondary | ICD-10-CM | POA: Diagnosis not present

## 2022-11-20 DIAGNOSIS — F1091 Alcohol use, unspecified, in remission: Secondary | ICD-10-CM | POA: Diagnosis not present

## 2022-11-20 DIAGNOSIS — F17211 Nicotine dependence, cigarettes, in remission: Secondary | ICD-10-CM | POA: Diagnosis present

## 2022-11-20 DIAGNOSIS — F1010 Alcohol abuse, uncomplicated: Secondary | ICD-10-CM

## 2022-11-20 DIAGNOSIS — G4733 Obstructive sleep apnea (adult) (pediatric): Secondary | ICD-10-CM | POA: Insufficient documentation

## 2022-11-20 DIAGNOSIS — R9431 Abnormal electrocardiogram [ECG] [EKG]: Secondary | ICD-10-CM | POA: Insufficient documentation

## 2022-11-20 NOTE — Patient Instructions (Addendum)
DECREASE your furosemide to 1/2 tablet once daily  Go DOWN to LOWER LEVEL (LL) to get your bloodwork completed inside [facility] office

## 2022-12-19 ENCOUNTER — Ambulatory Visit: Payer: Medicare Other | Attending: Cardiology | Admitting: Cardiology

## 2022-12-19 ENCOUNTER — Encounter: Payer: Self-pay | Admitting: Cardiology

## 2022-12-19 VITALS — BP 108/78 | HR 73 | Ht 61.0 in | Wt 229.0 lb

## 2022-12-19 DIAGNOSIS — G4733 Obstructive sleep apnea (adult) (pediatric): Secondary | ICD-10-CM | POA: Insufficient documentation

## 2022-12-19 DIAGNOSIS — I10 Essential (primary) hypertension: Secondary | ICD-10-CM | POA: Diagnosis present

## 2022-12-19 NOTE — Progress Notes (Signed)
Sleep Medicine CONSULT Note    Date:  12/19/2022   ID:  Sabrina Mata, DOB 1980/05/29, MRN [id number]  PCP:  Pcp, No  Cardiologist: Julien Nordmann, MD  Chief Complaint  Patient presents with   New Patient (Initial Visit)    Obstructive sleep apnea    History of Present Illness:  Sabrina Mata is a 42 y.o. female who is being seen today for the evaluation of obstructive sleep apnea at the request of Julien Nordmann, MD.  This is a 42 year old female with a history of anxiety, depression, bipolar, tobacco abuse, alcohol abuse and recent diagnosis of HFrEF.  She also has morbid obesity with a BMI of 48.  Due to her comorbidities for sleep apnea and morbid obesity a home sleep study was recommended.  She underwent home sleep study on 08/29/2022 showing moderate obstructive sleep apnea with an AHI of 16.4/h.  She was started on auto CPAP from 4 to 15 cm H2O.  She is now referred for sleep medicine consultation to establish sleep care.  She is doing well with her PAP device.  She uses the nasal pillow mask that she tolerates well.    She feels the pressure is adequate.  Since going on PAP she feels rested in the am but does get sleepy some in the day that she attributes to some of her meds.  She does use her device when she takes naps..  She denies any significant mouth or nasal dryness or nasal congestion.  She does not think that she snores according to her kids that used to hear her through the walls at home.    Past Medical History:  Diagnosis Date   Anxiety    Bipolar 1 disorder, depressed (HCC)    CHF (congestive heart failure) (HCC)    Depression    Hyperlipidemia    OSA on CPAP    moderate obstructive sleep apnea with an AHI of 16.4/h.  On auto CPAP 4 to 15cm H2O    Past Surgical History:  Procedure Laterality Date   CESAREAN SECTION     times 2   SHOULDER SURGERY Right     Current Medications: Current Meds  Medication Sig   carvedilol (COREG) 12.5  MG tablet Take 1 tablet (12.5 mg total) by mouth 2 (two) times daily.   empagliflozin (JARDIANCE) 10 MG TABS tablet Take 1 tablet (10 mg total) by mouth daily.   furosemide (LASIX) 40 MG tablet Take 1 tablet (40 mg total) by mouth daily. (Patient taking differently: Take 20 mg by mouth daily.)   losartan (COZAAR) 25 MG tablet Take 1 tablet (25 mg total) by mouth daily.   Multiple Vitamin (MULTIVITAMIN WITH MINERALS) TABS tablet Take 1 tablet by mouth daily.   spironolactone (ALDACTONE) 25 MG tablet Take 0.5 tablets (12.5 mg total) by mouth daily.   thiamine (VITAMIN B-1) 100 MG tablet Take 1 tablet (100 mg total) by mouth daily.    Allergies:   Patient has no known allergies.   Social History   Socioeconomic History   Marital status: Single    Spouse name: NA   Number of children: 2   Years of education: 11   Highest education level: GED or equivalent  Occupational History   Occupation: unemployed   Occupation: SSI   Tobacco Use   Smoking status: Former    Current packs/day: 0.50    Average packs/day: 0.5 packs/day for 20.0 years (10.0 ttl pk-yrs)  Types: Cigarettes   Smokeless tobacco: Never   Tobacco comments:    denies 2nd smoke  Vaping Use   Vaping status: Never Used  Substance and Sexual Activity   Alcohol use: Yes    Alcohol/week: 2.0 standard drinks of alcohol    Types: 2 Shots of liquor per week    Comment: patient drinks 1-3x per week    Drug use: Not Currently   Sexual activity: Not on file  Other Topics Concern   Not on file  Social History Narrative   Patient is a single mom of two children ages 39 and 38yo. She receives disability due to her mental health diagnoses. She has one close friend and has a supportive friendship with the father of her children. She lost 4 loved ones due to Covid-19 from 04/2019 - 06/2019, including her grandmother, her mother, her uncle and her mother's boyfriend. She also recently suffered a miscarriage.    Social Determinants of  Health   Financial Resource Strain: Not on file  Food Insecurity: No Food Insecurity (08/08/2022)   Hunger Vital Sign    Worried About Running Out of Food in the Last Year: Never true    Ran Out of Food in the Last Year: Never true  Transportation Needs: No Transportation Needs (08/08/2022)   PRAPARE - Administrator, Civil Service (Medical): No    Lack of Transportation (Non-Medical): No  Physical Activity: Not on file  Stress: Not on file  Social Connections: Not on file     Family History:  The patient's family history includes COPD in her mother; Drug abuse in her mother; Heart disease in her mother; Sickle cell trait in her brother.   ROS:   Please see the history of present illness.    ROS All other systems reviewed and are negative.      No data to display             PHYSICAL EXAM:   VS:  BP 108/78   Pulse 73   Ht 5\' 1"  (1.549 m)   Wt 229 lb (103.9 kg)   LMP 10/30/2022   SpO2 97%   BMI 43.27 kg/m    GEN: Well nourished, well developed, in no acute distress  HEENT: normal  Neck: no JVD, carotid bruits, or masses Cardiac: RRR; no murmurs, rubs, or gallops,no edema.  Intact distal pulses bilaterally.  Respiratory:  clear to auscultation bilaterally, normal work of breathing GI: soft, nontender, nondistended, + BS MS: no deformity or atrophy  Skin: warm and dry, no rash Neuro:  Alert and Oriented x 3, Strength and sensation are intact Psych: euthymic mood, full affect  Wt Readings from Last 3 Encounters:  12/19/22 229 lb (103.9 kg)  11/20/22 228 lb (103.4 kg)  10/30/22 228 lb 4 oz (103.5 kg)      Studies/Labs Reviewed:   Home sleep study and PAP compliance download  Recent Labs: 08/07/2022: ALT 37; Hemoglobin 14.4; Platelets 364 08/08/2022: TSH 2.099 10/30/2022: B Natriuretic Peptide 64.8 11/20/2022: BUN 16; Creatinine, Ser 1.05; Potassium 4.3; Sodium 140    ASSESSMENT:    1. OSA (obstructive sleep apnea)   2. Primary hypertension   3.  OSA on CPAP      PLAN:  In order of problems listed above: OSA - The patient is tolerating PAP therapy well without any problems. The PAP download performed by his DME was personally reviewed and interpreted by me today and showed an AHI of 0.5 /hr on  auto CPAP from 4-15 cm H2O with 0% compliance in using more than 4 hours nightly.  On average she is using her device 2 hours nightly but missed 8 out of the last 30 days.  The patient has been using and benefiting from PAP use and will continue to benefit from therapy.  -I encouraged her to be more compliant with her device.  I explained to her that if she does not use her device at least 4 hours nightly for more than 70% of the time insurance will make return her device and will have to start over -She understands that the more she uses her device the better all of her heart will be -I told her that she needs to use her device at least 5 hours nightly   Hypertension -BP is controlled on exam today -Continue prescription drug management with carvedilol 12.5 mg twice daily, losartan 25 mg daily and spironolactone 12.5 mg daily with as needed refills   Time Spent: 20 minutes total time of encounter, including 15 minutes spent in face-to-face patient care on the date of this encounter. This time includes coordination of care and counseling regarding above mentioned problem list. Remainder of non-face-to-face time involved reviewing chart documents/testing relevant to the patient encounter and documentation in the medical record. I have independently reviewed documentation from referring provider  Medication Adjustments/Labs and Tests Ordered: Current medicines are reviewed at length with the patient today.  Concerns regarding medicines are outlined above.  Medication changes, Labs and Tests ordered today are listed in the Patient Instructions below.  There are no Patient Instructions on file for this visit.   Signed, Armanda Magic, MD  12/19/2022  1:36 PM    [facility] [address] [place], [place], [place]  [address] Phone: [phone]; Fax: [phone]

## 2022-12-19 NOTE — Patient Instructions (Signed)

## 2022-12-25 ENCOUNTER — Ambulatory Visit: Payer: Medicare Other | Attending: Family | Admitting: Family

## 2022-12-25 ENCOUNTER — Encounter: Payer: Self-pay | Admitting: Family

## 2022-12-25 VITALS — BP 133/92 | HR 61 | Ht 61.0 in | Wt 232.0 lb

## 2022-12-25 DIAGNOSIS — G4733 Obstructive sleep apnea (adult) (pediatric): Secondary | ICD-10-CM | POA: Diagnosis not present

## 2022-12-25 DIAGNOSIS — Z87891 Personal history of nicotine dependence: Secondary | ICD-10-CM | POA: Insufficient documentation

## 2022-12-25 DIAGNOSIS — Z79899 Other long term (current) drug therapy: Secondary | ICD-10-CM | POA: Insufficient documentation

## 2022-12-25 DIAGNOSIS — F1010 Alcohol abuse, uncomplicated: Secondary | ICD-10-CM | POA: Diagnosis not present

## 2022-12-25 DIAGNOSIS — E785 Hyperlipidemia, unspecified: Secondary | ICD-10-CM | POA: Insufficient documentation

## 2022-12-25 DIAGNOSIS — I5022 Chronic systolic (congestive) heart failure: Secondary | ICD-10-CM | POA: Diagnosis present

## 2022-12-25 DIAGNOSIS — R000 Tachycardia, unspecified: Secondary | ICD-10-CM

## 2022-12-25 DIAGNOSIS — F319 Bipolar disorder, unspecified: Secondary | ICD-10-CM | POA: Insufficient documentation

## 2022-12-25 DIAGNOSIS — I428 Other cardiomyopathies: Secondary | ICD-10-CM | POA: Diagnosis not present

## 2022-12-25 MED ORDER — SPIRONOLACTONE 25 MG PO TABS: 25.0000 mg | ORAL_TABLET | Freq: Every day | ORAL | 6 refills | Status: DC

## 2022-12-25 MED ORDER — FUROSEMIDE 40 MG PO TABS: 20.0000 mg | ORAL_TABLET | Freq: Every day | ORAL | 3 refills | Status: DC

## 2022-12-25 MED ORDER — LOSARTAN POTASSIUM 25 MG PO TABS: 25.0000 mg | ORAL_TABLET | Freq: Every day | ORAL | 5 refills | Status: DC

## 2022-12-25 NOTE — Patient Instructions (Addendum)
Medication Changes:  Continue taking Spironolactone 25 mg (1 tab) Daily  Lab Work:  none  Testing/Procedures:  Cardiac CT, see instructions below  Referrals:  none  Special Instructions // Education:  Do the following things EVERYDAY: Weigh yourself in the morning before breakfast. Write it down and keep it in a log. Take your medicines as prescribed Eat low salt foods--Limit salt (sodium) to 2000 mg per day.  Stay as active as you can everyday Limit all fluids for the day to less than 2 liters     Your cardiac CT will be scheduled at one of the below locations:   [facility] [address] [place], [place] [address] [phone]  Please arrive at the [facility] Entrance (Entrance C2) of [facility] 30 minutes prior to test start time. You can use the FREE valet parking offered at entrance C (encouraged to control the heart rate for the test)  Proceed to the [facility] Radiology Department (first floor) to check-in and test prep.  All radiology patients and guests should use entrance C2 at [facility], accessed from [facility], even though the hospital's physical address listed is [address]     Please follow these instructions carefully (unless otherwise directed):  An IV will be required for this test and Nitroglycerin will be given.  Hold all erectile dysfunction medications at least 3 days (72 hrs) prior to test. (Ie viagra, cialis, sildenafil, tadalafil, etc)   On the Night Before the Test: Be sure to Drink plenty of water. Do not consume any caffeinated/decaffeinated beverages or chocolate 12 hours prior to your test. Do not take any antihistamines 12 hours prior to your test.  On the Day of the Test: Drink plenty of water until 1 hour prior to the test. Do not eat any food 1 hour prior to test. You may take your regular medications prior to the test.  If you take  Furosemide/Hydrochlorothiazide/Spironolactone, please HOLD on the morning of the test. FEMALES- please wear underwire-free bra if available, avoid dresses & tight clothing  After the Test: Drink plenty of water. After receiving IV contrast, you may experience a mild flushed feeling. This is normal. On occasion, you may experience a mild rash up to 24 hours after the test. This is not dangerous. If this occurs, you can take Benadryl 25 mg and increase your fluid intake. If you experience trouble breathing, this can be serious. If it is severe call 911 IMMEDIATELY. If it is mild, please call our office. If you take any of these medications: Glipizide/Metformin, Avandament, Glucavance, please do not take 48 hours after completing test unless otherwise instructed.  We will call to schedule your test 2-4 weeks out understanding that some insurance companies will need an authorization prior to the service being performed.   For more information and frequently asked questions, please visit our website : [email or web address]  For non-scheduling related questions, please contact the cardiac imaging nurse navigator should you have any questions/concerns: Cardiac Imaging Nurse Navigators Direct Office Dial: [phone]   For scheduling needs, including cancellations and rescheduling, please call [place], [phone].   Follow-Up in: after Cardiac CT with Dr Gasper Lloyd, once CT is scheduled we will call you to schedule this appointment    If you have any questions or concerns before your next appointment please send [place] a message through [facility] or call our office at [phone] Monday-Friday 8 am-5 pm.   If you have an urgent need  after hours on the weekend please call your Primary Cardiologist or the [facility] in [place] at [phone].

## 2022-12-25 NOTE — Progress Notes (Signed)
PCP: Pcp, No Cardiologist: [facility] during 05/24 admission HF provider: Dorthula Nettles, DO (last seen 07/24)  HPI:  Sabrina Mata is a 42 y/o female with a history of anxiety (2004), bipolar, depression, hyperlipidemia, previous alcohol use, morbid obesity, OSA, previous tobacco use and chronic heart failure.   Admitted 08/08/22 due to SOB, DOE and orthopnea for a week prior. Covid 2023. CTA negative for PE. Initially needed bipap and then transitioned to 4L  Echo 08/09/22: EF 30-35% along with Grade II DD and mild/moderate MR.   Chest CT 08/08/22: with cardiomegaly, trace bilateral pleural effusions   cMRI: 10/10/22: Normal LV size, moderately reduced systolic function.  LVEF 32%.  2.  No LV LGE/scar noted.  3.  No evidence for myocardial infiltration.  4.  Normal RV size and function.  5.  No significant valvular abnormalities.  6.  Etiology for cardiomyopathy appears non ischemic.  She presents today for a HF f/u visit with a chief complaint of minimal fatigue with moderate exertion. Chronic in nature although says that her energy level is overall much improved. She has associated intermittent dizziness after taking meds. Denies shortness of breath, chest pain, cough, palpitations, abdominal distention, pedal edema or difficulty sleeping. Is taking spironolactone 25mg  once daily and says that she feels better taking a whole tablet vs 1/2 tablet.    Wearing CPAP nightly and says that she wakes up feeling less groggy. Does understand the importance of wearing her CPAP more consistently. She says she goes to sleep with it on but when she gets up at night to urinate, she hasn't been putting it back on but is now going to start doing that. She does wear it if she takes a nap during the ay.   Hasn't taken diuretic in the last 3 days due to being out of town on vacation. Hasn't taken any of her medications yet this morning but will do so upon her return home today. While on vacation, she admits that she  was eating saltier foods, like pizza, but is now back to monitoring her sodium intake.   She is concerned about her teeth as she has teeth that are starting to chip. Didn't go to a dentist growing up and doesn't think she has any dental coverage through medicare. Denies any dental pain, bleeding gums or redness/ swelling of the gums.   She is a single mom of 35 & 58 y/o daughters. Has disability due to her mental health. In 2021, she had 4 loved ones die from covid (grandmother, mother, uncle and mother's boyfriend) and she says that after all of that happened, she started smoking 1-1.5 packs of cigarettes at night as well as drink 1/2-1 bottle of tequila at night to numb the pain. Smoke and drank like this for the last 3 years. Stopped completely during 05/24 admission.   ROS: All systems negative except as listed in HPI, PMH and Problem List.  SH:  Social History   Socioeconomic History   Marital status: Single    Spouse name: NA   Number of children: 2   Years of education: 11   Highest education level: GED or equivalent  Occupational History   Occupation: unemployed   Occupation: SSI   Tobacco Use   Smoking status: Former    Current packs/day: 0.50    Average packs/day: 0.5 packs/day for 20.0 years (10.0 ttl pk-yrs)    Types: Cigarettes   Smokeless tobacco: Never   Tobacco comments:    denies 2nd smoke  Vaping Use   Vaping status: Never Used  Substance and Sexual Activity   Alcohol use: Yes    Alcohol/week: 2.0 standard drinks of alcohol    Types: 2 Shots of liquor per week    Comment: patient drinks 1-3x per week    Drug use: Not Currently   Sexual activity: Not on file  Other Topics Concern   Not on file  Social History Narrative   Patient is a single mom of two children ages 57 and 95yo. She receives disability due to her mental health diagnoses. She has one close friend and has a supportive friendship with the father of her children. She lost 4 loved ones due to  Covid-19 from 04/2019 - 06/2019, including her grandmother, her mother, her uncle and her mother's boyfriend. She also recently suffered a miscarriage.    Social Determinants of Health   Financial Resource Strain: Not on file  Food Insecurity: No Food Insecurity (08/08/2022)   Hunger Vital Sign    Worried About Running Out of Food in the Last Year: Never true    Ran Out of Food in the Last Year: Never true  Transportation Needs: No Transportation Needs (08/08/2022)   PRAPARE - Administrator, Civil Service (Medical): No    Lack of Transportation (Non-Medical): No  Physical Activity: Not on file  Stress: Not on file  Social Connections: Not on file  Intimate Partner Violence: Not At Risk (08/08/2022)   Humiliation, Afraid, Rape, and Kick questionnaire    Fear of Current or Ex-Partner: No    Emotionally Abused: No    Physically Abused: No    Sexually Abused: No    FH:  Family History  Problem Relation Age of Onset   COPD Mother    Heart disease Mother    Drug abuse Mother    Sickle cell trait Brother     Past Medical History:  Diagnosis Date   Anxiety    Bipolar 1 disorder, depressed (HCC)    CHF (congestive heart failure) (HCC)    Depression    Hyperlipidemia    OSA on CPAP    moderate obstructive sleep apnea with an AHI of 16.4/h.  On auto CPAP 4 to 15cm H2O    Current Outpatient Medications  Medication Sig Dispense Refill   carvedilol (COREG) 12.5 MG tablet Take 1 tablet (12.5 mg total) by mouth 2 (two) times daily. 180 tablet 3   empagliflozin (JARDIANCE) 10 MG TABS tablet Take 1 tablet (10 mg total) by mouth daily. 30 tablet 5   furosemide (LASIX) 40 MG tablet Take 1 tablet (40 mg total) by mouth daily. (Patient taking differently: Take 20 mg by mouth daily.) 30 tablet 5   losartan (COZAAR) 25 MG tablet Take 1 tablet (25 mg total) by mouth daily. 30 tablet 5   Multiple Vitamin (MULTIVITAMIN WITH MINERALS) TABS tablet Take 1 tablet by mouth daily.      spironolactone (ALDACTONE) 25 MG tablet Take 0.5 tablets (12.5 mg total) by mouth daily. 45 tablet 5   thiamine (VITAMIN B-1) 100 MG tablet Take 1 tablet (100 mg total) by mouth daily. 30 tablet 0   No current facility-administered medications for this visit.   Vitals:   12/25/22 1009  BP: (!) 133/92  Pulse: 61  SpO2: 100%  Weight: 232 lb (105.2 kg)  Height: 5\' 1"  (1.549 m)   Wt Readings from Last 3 Encounters:  12/25/22 232 lb (105.2 kg)  12/19/22 229 lb (103.9 kg)  11/20/22 228 lb (103.4 kg)   Lab Results  Component Value Date   CREATININE 1.05 (H) 11/20/2022   CREATININE 1.08 (H) 10/30/2022   CREATININE 1.03 (H) 10/03/2022    PHYSICAL EXAM:  General:  Well appearing. No resp difficulty HEENT: normal Neck: supple. JVP flat. No lymphadenopathy or thryomegaly appreciated. Cor: PMI normal. Regular rate & rhythm. No rubs, gallops or murmurs. Lungs: clear Abdomen: soft, nontender, nondistended. No hepatosplenomegaly. No bruits or masses.  Extremities: no cyanosis, clubbing, rash, edema Neuro: alert & oriented x3, cranial nerves grossly intact. Moves all 4 extremities w/o difficulty. Affect pleasant   ECG: not done   ASSESSMENT & PLAN:  1: NICM with reduced ejection fraction- - suspect due to alcohol use or untreated OSA - NYHA class II - euvolemic today - weighing most days; reminded to call for an overnight weight gain of > 2 pounds or a weekly weight gain of > 5 pounds - weight up 4 pounds from last visit here 1 month ago - Echo 08/11/52: EF 30-35% along with Grade II DD and mild/moderate MR.  - Chest CT 08/08/22: with cardiomegaly, trace bilateral pleural effusions  cMRI: 10/10/22: Normal LV size, moderately reduced systolic function.  LVEF 32%.  2.  No LV LGE/scar noted.  3.  No evidence for myocardial infiltration.  4.  Normal RV size and function.  5.  No significant valvular abnormalities.  6.  Etiology for cardiomyopathy appears non ischemic. - will schedule  coronary CT to rule out ischemic disease now that her HR is under better control - continue carvedilol 12.5mg  BID - continue jardiance 10mg  daily - continue furosemide to 20mg  daily - continue potassium daily - continue losartan 25mg  daily; BP will not tolerate changing this to entresto - continue spironolactone 25mg  daily - will get BMP updated in the next 1-2 weeks - walking 1-2 laps at the track every day - BNP 08/08/22 was 277.6  2: Tobacco/ Alcohol use- - BMP 11/20/22 showed sodium 140, potassium 4.3, creatinine 1.05 & GFR 68 - has not smoked or had any alcohol use since admission 08/08/22; had drank heavily for previous 3 years (1/2-1 bottle of tequila nightly) - congratulated her on this and encouraged her to continue abstaining  3: ST- - per previous EKG's, ST has been present since 02/23 [facility] cardiology saw patient during recent admission in 05/24 - continue carvedilol 12.5mg  BID - HR 61 today - zio 06/24 was sinus rhythm with ave HR of 81 bpm; rare PACs and PVCs  4: OSA- - wearing CPAP nightly with nasal pillow mask; admits that she doesn't put it back on when she removes it in the middle of the night - understands the importance of wearing it consistently to get the benefit for her heart and so that her insurance doesn't require her to send it back - wears it when napping - saw sleep specialist (Turner) 09/24  Return 1 week after coronary CT, sooner if needed

## 2022-12-26 ENCOUNTER — Telehealth: Payer: Self-pay

## 2022-12-26 DIAGNOSIS — I5022 Chronic systolic (congestive) heart failure: Secondary | ICD-10-CM

## 2022-12-26 NOTE — Telephone Encounter (Addendum)
Pt aware, agreeable, and verbalized understanding Lab orders placed.  ----- Message from Delma Freeze  ----- Regarding: labs Due to monitoring her potassium / kidney function while on spironolactone, please ask her to get BMET in the next couple of weeks that way it can be updated before her test.

## 2022-12-31 ENCOUNTER — Ambulatory Visit: Payer: Medicare Other | Admitting: Cardiology

## 2023-01-03 ENCOUNTER — Encounter: Payer: Self-pay | Admitting: Family

## 2023-01-03 ENCOUNTER — Other Ambulatory Visit
Admission: RE | Admit: 2023-01-03 | Discharge: 2023-01-03 | Disposition: A | Discharge status: Home / Self Care | Payer: Medicare Other | Attending: Family | Admitting: Family

## 2023-01-03 DIAGNOSIS — I5022 Chronic systolic (congestive) heart failure: Secondary | ICD-10-CM | POA: Insufficient documentation

## 2023-01-03 LAB — BASIC METABOLIC PANEL
Anion gap: 11 (ref 5–15)
BUN: 15 mg/dL (ref 6–20)
CO2: 24 mmol/L (ref 22–32)
Calcium: 9 mg/dL (ref 8.9–10.3)
Chloride: 101 mmol/L (ref 98–111)
Creatinine, Ser: 0.89 mg/dL (ref 0.44–1.00)
GFR, Estimated: >60 mL/min (ref >60)
Glucose, Bld: 103 mg/dL — ABNORMAL HIGH (ref 70–99)
Potassium: 3.7 mmol/L (ref 3.5–5.1)
Sodium: 136 mmol/L (ref 135–145)

## 2023-01-14 ENCOUNTER — Ambulatory Visit: Payer: Medicare Other

## 2023-01-21 ENCOUNTER — Encounter (HOSPITAL_COMMUNITY): Payer: Self-pay

## 2023-01-23 ENCOUNTER — Ambulatory Visit
Admission: RE | Admit: 2023-01-23 | Discharge: 2023-01-23 | Disposition: A | Discharge status: Home / Self Care | Payer: Medicare Other | Source: Ambulatory Visit | Attending: Family | Admitting: Family

## 2023-01-23 ENCOUNTER — Encounter: Payer: Self-pay | Admitting: Family

## 2023-01-23 DIAGNOSIS — I5022 Chronic systolic (congestive) heart failure: Secondary | ICD-10-CM | POA: Insufficient documentation

## 2023-01-23 MED ORDER — METOPROLOL TARTRATE 5 MG/5ML IV SOLN
10.0000 mg | Freq: Once | INTRAVENOUS | Status: AC
  Administered 2023-01-23: 10 mg via INTRAVENOUS
  Filled 2023-01-23: qty 10

## 2023-01-23 MED ORDER — NITROGLYCERIN 0.4 MG SL SUBL
0.8000 mg | SUBLINGUAL_TABLET | Freq: Once | SUBLINGUAL | Status: AC
  Administered 2023-01-23: 0.8 mg via SUBLINGUAL
  Filled 2023-01-23: qty 25

## 2023-01-23 MED ORDER — IOHEXOL 350 MG/ML SOLN
80.0000 mL | Freq: Once | INTRAVENOUS | Status: AC | PRN
  Administered 2023-01-23: 80 mL via INTRAVENOUS

## 2023-01-23 MED ORDER — METOPROLOL TARTRATE 5 MG/5ML IV SOLN
INTRAVENOUS | Status: AC
  Filled 2023-01-23: qty 10

## 2023-01-23 NOTE — Progress Notes (Signed)

## 2023-02-22 ENCOUNTER — Encounter: Payer: Self-pay | Admitting: Family

## 2023-02-22 ENCOUNTER — Encounter: Payer: Self-pay | Admitting: Cardiology

## 2023-03-04 ENCOUNTER — Other Ambulatory Visit (HOSPITAL_COMMUNITY): Payer: Self-pay

## 2023-03-04 ENCOUNTER — Encounter: Payer: Self-pay | Admitting: Family

## 2023-03-04 ENCOUNTER — Other Ambulatory Visit: Payer: Self-pay | Admitting: Family

## 2023-03-04 MED ORDER — EMPAGLIFLOZIN 10 MG PO TABS: 10.0000 mg | ORAL_TABLET | Freq: Every day | ORAL | 3 refills | Status: DC

## 2023-03-05 ENCOUNTER — Telehealth (HOSPITAL_COMMUNITY): Payer: Self-pay | Admitting: Licensed Clinical Social Worker

## 2023-03-05 NOTE — Telephone Encounter (Addendum)
H&V Care Navigation CSW Progress Note  Clinical Social Worker consulted to help discuss issues with obtaining CPAP through insurance.  CSW spoke with patient who reports that Medicare was paying for CPAP but she wasn't using it enough to cover it.  Reports that due to traumatic past she does not sleep more than 4 hours a night and Medicare requiring at least  4 hours a night to be considered compliant.  Talked to [employer] where pt get equipment and they report all paperwork would need to be resubmitted to get new CPAP.  Patient agreeable- provider updated  SDOH Screenings   Food Insecurity: No Food Insecurity (08/08/2022)  Housing: Low Risk  (08/08/2022)  Transportation Needs: No Transportation Needs (08/08/2022)  Utilities: Not At Risk (08/08/2022)  Depression (PHQ2-9): Medium Risk (10/27/2019)  Tobacco Use: Medium Risk (12/25/2022)   Burna Sis, LCSW Clinical Social Worker [facility] Desk#: [phone] Cell#: [phone]

## 2023-03-06 ENCOUNTER — Telehealth (HOSPITAL_COMMUNITY): Payer: Self-pay | Admitting: Licensed Clinical Social Worker

## 2023-03-06 NOTE — Telephone Encounter (Signed)
Patient returned call and reports she is willing to start process again to get CPAP but is worried about getting it taken away again due to noncompliance given her lack of sleep.  CSW will inform provider to order new sleep study.  CSW also asked about lack of PCP- patient is agreeable to getting one and would prefer CSW call  them to find appt- will work on and get back to patient.  Burna Sis, LCSW Clinical Social Worker [facility] Desk#: [phone] Cell#: [phone]

## 2023-03-06 NOTE — Telephone Encounter (Signed)
CSW attempted to call pt x2 today to discuss need to restart CPAP authorization process and ask if she would be willing to complete a home sleep study- unable to reach left VM requesting return call  Burna Sis, LCSW Clinical Social Worker [facility] Desk#: [phone] Cell#: [phone]

## 2023-03-11 ENCOUNTER — Other Ambulatory Visit: Payer: Self-pay

## 2023-03-11 DIAGNOSIS — I5022 Chronic systolic (congestive) heart failure: Secondary | ICD-10-CM

## 2023-03-12 ENCOUNTER — Telehealth: Payer: Self-pay

## 2023-03-12 NOTE — Telephone Encounter (Signed)
Call to patient to discuss cognitive behavioral therapy. Education provided about how teaching the body and mind to be less vigilant at night can help patient to sleep, while cpap keeps airway open while sleeping. Patient said that at this time, she does not feel she can afford copay/deductible for cognitive behavioral therapy visits with the year starting over. She states she has already spoken to Clarisa Kindred, FNP at heart failure clinic about doing another sleep study in order to try and get her machine back. She states she would prefer to go this route before committing to cognitive behavioral therapy. I encouraged patient to contact Clarisa Kindred FNP at heart failure clinic to discuss next steps. Patient verbalized understanding.

## 2023-03-13 ENCOUNTER — Telehealth: Payer: Self-pay

## 2023-03-13 NOTE — Telephone Encounter (Signed)
Faxed to [employer] [facility], per Clarisa Kindred, FNP request:  Sleep study results from 08/2022 Dr. Mayford Knife last office visit note CPAP orders

## 2023-04-12 ENCOUNTER — Encounter: Payer: Medicare Other | Admitting: Cardiology

## 2023-04-23 ENCOUNTER — Ambulatory Visit: Payer: Medicare Other | Admitting: Cardiology

## 2023-04-23 ENCOUNTER — Encounter: Payer: Self-pay | Admitting: Cardiology

## 2023-04-23 ENCOUNTER — Other Ambulatory Visit
Admission: RE | Admit: 2023-04-23 | Discharge: 2023-04-23 | Disposition: A | Discharge status: Home / Self Care | Payer: Medicare Other | Source: Ambulatory Visit | Attending: Cardiology | Admitting: Cardiology

## 2023-04-23 VITALS — BP 117/69 | HR 79 | Wt 235.6 lb

## 2023-04-23 DIAGNOSIS — I5022 Chronic systolic (congestive) heart failure: Secondary | ICD-10-CM

## 2023-04-23 DIAGNOSIS — F1010 Alcohol abuse, uncomplicated: Secondary | ICD-10-CM | POA: Insufficient documentation

## 2023-04-23 DIAGNOSIS — I10 Essential (primary) hypertension: Secondary | ICD-10-CM | POA: Insufficient documentation

## 2023-04-23 DIAGNOSIS — G4733 Obstructive sleep apnea (adult) (pediatric): Secondary | ICD-10-CM | POA: Diagnosis present

## 2023-04-23 DIAGNOSIS — I5041 Acute combined systolic (congestive) and diastolic (congestive) heart failure: Secondary | ICD-10-CM

## 2023-04-23 LAB — LIPID PANEL
Cholesterol: 207 mg/dL — ABNORMAL HIGH (ref 0–200)
HDL: 33 mg/dL — ABNORMAL LOW (ref >40)
LDL Cholesterol: 156 mg/dL — ABNORMAL HIGH (ref 0–99)
Total CHOL/HDL Ratio: 6.3 {ratio}
Triglycerides: 88 mg/dL (ref <150)
VLDL: 18 mg/dL (ref 0–40)

## 2023-04-23 LAB — BASIC METABOLIC PANEL
Anion gap: 13 (ref 5–15)
BUN: 14 mg/dL (ref 6–20)
CO2: 26 mmol/L (ref 22–32)
Calcium: 9.1 mg/dL (ref 8.9–10.3)
Chloride: 98 mmol/L (ref 98–111)
Creatinine, Ser: 0.93 mg/dL (ref 0.44–1.00)
GFR, Estimated: >60 mL/min (ref >60)
Glucose, Bld: 91 mg/dL (ref 70–99)
Potassium: 3.9 mmol/L (ref 3.5–5.1)
Sodium: 137 mmol/L (ref 135–145)

## 2023-04-23 LAB — BRAIN NATRIURETIC PEPTIDE: B Natriuretic Peptide: 22.9 pg/mL (ref 0.0–100.0)

## 2023-04-23 MED ORDER — LOSARTAN POTASSIUM 50 MG PO TABS: 50.0000 mg | ORAL_TABLET | Freq: Every day | ORAL | 3 refills | Status: DC

## 2023-04-23 NOTE — Progress Notes (Signed)
 [facility] NOTE  Referring Physician: No ref. provider found  Primary Care: Pcp, No HF: Dr. Gardenia  HPI: Sabrina Mata is a 43 y.o. female with heart failure with reduced ejection fraction presenting today to establish care.  Her cardiac history dates back to May 2024 when she presented to [facility] with complaints of shortness of breath over the preceding few weeks.  During admission she had an echocardiogram that demonstrated LVEF of 30 to 35% with moderate eccentric mitral regurgitation followed by a cardiac MRI that was consistent with nonischemic but no infiltrative cardiomyopathy.  She was started on low-dose GDMT and discharged home.  Otherwise no significant family history of heart disease.  She is a single mother with 2 daughters that are 16/17 year olds.  She currently receives disability due to mental health.  Due to depression around multiple deaths in her family she was drinking very heavily over the past 1 year (up to 1 bottle of tequila nightly with 1 to 1-1/2 pack of cigarettes)  Since that time she has followed with [name] where GDMT was further uptitrated. Since diagnosis of her heart failure she has become very compliant with medications. She is no longer smoking or drinking alcohol.    Interval hx: - No shortness of breath - Has not smoked cigarettes in 9 months - Reports that she feels 'great'.     Past Medical History:  Diagnosis Date   Anxiety    Bipolar 1 disorder, depressed (HCC)    CHF (congestive heart failure) (HCC)    Depression    Hyperlipidemia    OSA on CPAP    moderate obstructive sleep apnea with an AHI of 16.4/h.  On auto CPAP 4 to 15cm H2O    Current Outpatient Medications  Medication Sig Dispense Refill   carvedilol  (COREG ) 12.5 MG tablet Take 1 tablet (12.5 mg total) by mouth 2 (two) times daily. 180 tablet 3   empagliflozin  (JARDIANCE ) 10 MG TABS tablet Take 1 tablet (10 mg total) by mouth daily. 90 tablet 3    furosemide  (LASIX ) 40 MG tablet Take 0.5 tablets (20 mg total) by mouth daily. 45 tablet 3   losartan  (COZAAR ) 25 MG tablet Take 1 tablet (25 mg total) by mouth daily. 30 tablet 5   Multiple Vitamin (MULTIVITAMIN WITH MINERALS) TABS tablet Take 1 tablet by mouth daily.     spironolactone  (ALDACTONE ) 25 MG tablet Take 1 tablet (25 mg total) by mouth daily. 30 tablet 6   thiamine  (VITAMIN B-1) 100 MG tablet Take 1 tablet (100 mg total) by mouth daily. 30 tablet 0   No current facility-administered medications for this visit.    No Known Allergies    Social History   Socioeconomic History   Marital status: Single    Spouse name: NA   Number of children: 2   Years of education: 11   Highest education level: GED or equivalent  Occupational History   Occupation: unemployed   Occupation: SSI   Tobacco Use   Smoking status: Former    Current packs/day: 0.50    Average packs/day: 0.5 packs/day for 20.0 years (10.0 ttl pk-yrs)    Types: Cigarettes   Smokeless tobacco: Never   Tobacco comments:    denies 2nd smoke  Vaping Use   Vaping status: Never Used  Substance and Sexual Activity   Alcohol use: Yes    Alcohol/week: 2.0 standard drinks of alcohol    Types: 2 Shots of liquor per week  Comment: patient drinks 1-3x per week    Drug use: Not Currently   Sexual activity: Not on file  Other Topics Concern   Not on file  Social History Narrative   Patient is a single mom of two children ages 64 and 53yo. She receives disability due to her mental health diagnoses. She has one close friend and has a supportive friendship with the father of her children. She lost 4 loved ones due to Covid-19 from 04/2019 - 06/2019, including her grandmother, her mother, her uncle and her mother's boyfriend. She also recently suffered a miscarriage.    Social Drivers of Corporate Investment Banker Strain: Not on file  Food Insecurity: No Food Insecurity (08/08/2022)   Hunger Vital Sign    Worried  About Running Out of Food in the Last Year: Never true    Ran Out of Food in the Last Year: Never true  Transportation Needs: No Transportation Needs (08/08/2022)   PRAPARE - Administrator, Civil Service (Medical): No    Lack of Transportation (Non-Medical): No  Physical Activity: Not on file  Stress: Not on file  Social Connections: Not on file  Intimate Partner Violence: Not At Risk (08/08/2022)   Humiliation, Afraid, Rape, and Kick questionnaire    Fear of Current or Ex-Partner: No    Emotionally Abused: No    Physically Abused: No    Sexually Abused: No      Family History  Problem Relation Age of Onset   COPD Mother    Heart disease Mother    Drug abuse Mother    Sickle cell trait Brother     PHYSICAL EXAM: Vitals:   04/23/23 1502  BP: 117/69  Pulse: 79  SpO2: 100%   GENERAL: Well nourished, well developed, and in no apparent distress at rest.  HEENT: There is no scleral icterus.  The mucous membranes are pink and moist.   CHEST: There are no chest wall deformities. There is no chest wall tenderness. Respirations are unlabored.  Lungs- CTA B/L CARDIAC:  JVP: 7 cm          Normal rate with regular rhythm. No murmur.  Pulses are 2+ and symmetrical in upper and lower extremities. No edema.  ABDOMEN: Soft, non-tender, non-distended. There are normal bowel sounds.  EXTREMITIES: Warm and well perfused.  NEUROLOGIC: Patient is oriented x3 with no obvious focal neurologic deficits.  PSYCH: Patients affect is appropriate SKIN: Warm and dry; no lesions or wounds.    DATA REVIEW  ECG: 10/30/22: sinus tachycardia  as per my personal interpretation  ECHO: 10/30/22: LVEF 30-35%, Grade II DD,  moderate eccentric MR as per my personal interpretation   CMR 10/10/22: Normal LV size, moderately reduced systolic function.  LVEF 32%.  2.  No LV LGE/scar noted.  3.  No evidence for myocardial infiltration.  4.  Normal RV size and function.  5.  No significant valvular  abnormalities.  6.  Etiology for cardiomyopathy appears non ischemic.     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  Plan on coronary CTA to rule out ischemic heart disease once she is better rate controlled NYHA class / AHA Stage:NYHA II Volume status & Diuretics: lasix  40mg  daily; takes extra 40mg  PRN at night.  Vasodilators: increase losartan  50mg  daily; will transition to Entresto  at follow up. Repeat echo at follow up.  Beta-Blocker:increase coreg  to 12.5mg  BID FMJ:dujmu spironolactone  12.5mg  daily Cardiometabolic:jardiance   10mg  daily Devices therapies & Valvulopathies: Not currently indicated Advanced therapies: Not indicated  2.  Hypertension -Now well-controlled  3. OSA - not using CPAP currently.  4.  History of alcohol/tobacco use -Was previously drinking up to 1 bottle of tequila a day due to multiple stressors.  She is now completely quit.  Also has stopped smoking.  We continued to discuss importance of this. She is doing very well.   5. Obesity - Body mass index is 44.52 kg/m. - Discussed weight loss at length; not interested in pharmacologic therapy.   I spent 37 minutes caring for this patient today including face to face time, significant counseling, seeing the patient, documenting in the record, and arranging follow ups.   Romyn Boswell Advanced [facility] Failure Mechanical Circulatory Support

## 2023-04-23 NOTE — Patient Instructions (Addendum)
 INCREASE YOUR LOSARTAN  50 MG ONCE DAILY   Go over to the [facility]ICAL MALL. Go pass the gift shop and have your blood work completed.  We will only call you if the results are abnormal or if the provider would like to make medication changes.   Please have your echo completed. You will check in for this at the [facility]. You have to arrive 15 MINS EARLY for preparation, otherwise you will have to reschedule.  PLEASE CALL US  TOMORROW TO GET THIS SCHEDULED, OUR SCHEDULER IS ALREADY GONE FOR THE DAY. WE WILL SCHEDULE YOUR FOLLOW UP ONCE YOUR ECHO IS SCHEDULED.

## 2023-04-23 NOTE — Addendum Note (Signed)
 Addended by: Jola Schmidt A on: 04/23/2023 03:44 PM   Modules accepted: Orders

## 2023-05-23 ENCOUNTER — Ambulatory Visit
Admission: RE | Admit: 2023-05-23 | Discharge: 2023-05-23 | Disposition: A | Discharge status: Home / Self Care | Payer: Medicare Other | Source: Ambulatory Visit | Attending: Cardiology | Admitting: Cardiology

## 2023-05-23 ENCOUNTER — Encounter: Payer: Self-pay | Admitting: Cardiology

## 2023-05-23 DIAGNOSIS — I509 Heart failure, unspecified: Secondary | ICD-10-CM | POA: Insufficient documentation

## 2023-05-23 DIAGNOSIS — I5041 Acute combined systolic (congestive) and diastolic (congestive) heart failure: Secondary | ICD-10-CM | POA: Diagnosis not present

## 2023-05-23 DIAGNOSIS — R0602 Shortness of breath: Secondary | ICD-10-CM | POA: Insufficient documentation

## 2023-05-23 DIAGNOSIS — I5022 Chronic systolic (congestive) heart failure: Secondary | ICD-10-CM

## 2023-05-23 DIAGNOSIS — G4730 Sleep apnea, unspecified: Secondary | ICD-10-CM | POA: Insufficient documentation

## 2023-05-23 DIAGNOSIS — I10 Essential (primary) hypertension: Secondary | ICD-10-CM

## 2023-05-23 DIAGNOSIS — I110 Hypertensive heart disease with heart failure: Secondary | ICD-10-CM | POA: Insufficient documentation

## 2023-05-23 DIAGNOSIS — F17200 Nicotine dependence, unspecified, uncomplicated: Secondary | ICD-10-CM | POA: Diagnosis not present

## 2023-05-23 DIAGNOSIS — E785 Hyperlipidemia, unspecified: Secondary | ICD-10-CM | POA: Insufficient documentation

## 2023-05-23 DIAGNOSIS — I429 Cardiomyopathy, unspecified: Secondary | ICD-10-CM | POA: Insufficient documentation

## 2023-05-23 DIAGNOSIS — G4733 Obstructive sleep apnea (adult) (pediatric): Secondary | ICD-10-CM

## 2023-05-23 DIAGNOSIS — F1010 Alcohol abuse, uncomplicated: Secondary | ICD-10-CM

## 2023-05-23 LAB — ECHOCARDIOGRAM COMPLETE
AR max vel: 2.89 cm2
AV Area VTI: 2.83 cm2
AV Area mean vel: 2.92 cm2
AV Mean grad: 2 mm[Hg]
AV Peak grad: 3.6 mm[Hg]
Ao pk vel: 0.96 m/s
Area-P 1/2: 2.41 cm2
Calc EF: 46.7 %
MV VTI: 2.98 cm2
S' Lateral: 3 cm
Single Plane A2C EF: 52.5 %
Single Plane A4C EF: 41.8 %

## 2023-05-23 NOTE — Progress Notes (Signed)
*  PRELIMINARY RESULTS* Echocardiogram 2D Echocardiogram has been performed.  Carolyne Fiscal 05/23/2023, 10:51 AM

## 2023-05-31 ENCOUNTER — Telehealth: Payer: Self-pay | Admitting: Cardiology

## 2023-05-31 NOTE — Telephone Encounter (Signed)
Pt confirmed appt for 06/03/23

## 2023-06-03 ENCOUNTER — Telehealth (HOSPITAL_COMMUNITY): Payer: Self-pay

## 2023-06-03 ENCOUNTER — Ambulatory Visit: Payer: Medicare Other | Attending: Cardiology | Admitting: Cardiology

## 2023-06-03 ENCOUNTER — Other Ambulatory Visit (HOSPITAL_COMMUNITY): Payer: Self-pay

## 2023-06-03 VITALS — BP 104/83 | HR 85 | Wt 237.0 lb

## 2023-06-03 DIAGNOSIS — I5022 Chronic systolic (congestive) heart failure: Secondary | ICD-10-CM

## 2023-06-03 MED ORDER — SACUBITRIL-VALSARTAN 24-26 MG PO TABS: 1.0000 | ORAL_TABLET | Freq: Two times a day (BID) | ORAL | 3 refills | Status: DC

## 2023-06-03 NOTE — Telephone Encounter (Signed)
 Advanced [facility] Failure Patient Advocate Encounter  Test billing for Sherryll Burger shows a copay of $12.15 for 30 or 90 day supply.  Burnell Blanks, CPhT Rx Patient Advocate Phone: [phone]

## 2023-06-03 NOTE — Patient Instructions (Signed)
 Medication Changes:  STOP TAKING LOSARTAN   START TAKING ENTRESTO 24/26MG  TWICE DAILY   Lab Work:  Go DOWN to LOWER LEVEL (LL) to have your blood work completed inside of [employer] office.  We will only call you if the results are abnormal or if the provider would like to make medication changes.   Follow-Up in: 1 MONTH AS SCHEDULED   If you have any questions or concerns before your next appointment please send [place] a message through mychart or call our office at [phone] Monday-Friday 8 am-5 pm.   If you have an urgent need after hours on the weekend please call your Primary Cardiologist or the [facility] in [place] at [phone].   At the [facility], you and your health needs are our priority. We have a designated team specialized in the treatment of Heart Failure. This Care Team includes your primary Heart Failure Specialized Cardiologist (physician), Advanced Practice Providers (APPs- Physician Assistants and Nurse Practitioners), and Pharmacist who all work together to provide you with the care you need, when you need it.   You may see any of the following providers on your designated Care Team at your next follow up:  Dr. Arvilla Meres Dr. Marca Ancona Dr. Dorthula Nettles Dr. Theresia Bough Tonye Becket, NP Robbie Lis, [place] [address] [place], [place] Brynda Peon, NP [place] Lee, NP Clarisa Kindred, NP Enos Fling, PharmD

## 2023-06-03 NOTE — Progress Notes (Signed)
 ADVANCED HEART FAILURE CLINIC NOTE  Referring Physician: No ref. provider found  Primary Care: Pcp, No HF: Dr. Gasper Lloyd  CC: HFrEF  HPI: Sabrina Mata is a 43 y.o. female with heart failure with reduced ejection fraction presenting today to establish care.  Her cardiac history dates back to May 2024 when she presented to [facility] with complaints of shortness of breath over the preceding few weeks.  During admission she had an echocardiogram that demonstrated LVEF of 30 to 35% with moderate eccentric mitral regurgitation followed by a cardiac MRI that was consistent with nonischemic but no infiltrative cardiomyopathy.  She was started on low-dose GDMT and discharged home.  Otherwise no significant family history of heart disease.  She is a single mother with 2 daughters that are 16/17 year olds.  She currently receives disability due to mental health.  Due to depression around multiple deaths in her family she was drinking very heavily over the past 1 year (up to 1 bottle of tequila nightly with 1 to 1-1/2 pack of cigarettes)  Since that time she has followed with [facility] where GDMT was further uptitrated. Since diagnosis of her heart failure she has become very compliant with medications. She is no longer smoking or drinking alcohol.    Interval hx: - Continuing to do very well from a HFrEF standpoint; minimal DOE, LE edema.  - Very stressed & emotional about her HFrEF diagnosis; her mother passed last year possibly due to heart disease also.  - Compliant with all medications.      Current Outpatient Medications  Medication Sig Dispense Refill   empagliflozin (JARDIANCE) 10 MG TABS tablet Take 1 tablet (10 mg total) by mouth daily. 90 tablet 3   furosemide (LASIX) 40 MG tablet Take 0.5 tablets (20 mg total) by mouth daily. 45 tablet 3   losartan (COZAAR) 50 MG tablet Take 1 tablet (50 mg total) by mouth daily. 90 tablet 3   Multiple Vitamin (MULTIVITAMIN WITH MINERALS) TABS  tablet Take 1 tablet by mouth daily.     spironolactone (ALDACTONE) 25 MG tablet Take 1 tablet (25 mg total) by mouth daily. 30 tablet 6   thiamine (VITAMIN B-1) 100 MG tablet Take 1 tablet (100 mg total) by mouth daily. 30 tablet 0   carvedilol (COREG) 12.5 MG tablet Take 1 tablet (12.5 mg total) by mouth 2 (two) times daily. 180 tablet 3   No current facility-administered medications for this visit.     PHYSICAL EXAM: Vitals:   06/03/23 0950  BP: 104/83  Pulse: 85  SpO2: 97%   GENERAL: NAD Lungs- cta CARDIAC:  JVP: 6 cm          Normal rate with regular rhythm. No murmur.  Pulses 2+. no edema.  ABDOMEN: Soft, non-tender, non-distended.  EXTREMITIES: Warm and well perfused.  NEUROLOGIC: No obvious FND  DATA REVIEW  ECG: 10/30/22: sinus tachycardia  as per my personal interpretation  ECHO: 10/30/22: LVEF 30-35%, Grade II DD,  moderate eccentric MR as per my personal interpretation   CMR 10/10/22: Normal LV size, moderately reduced systolic function.  LVEF 32%.  2.  No LV LGE/scar noted.  3.  No evidence for myocardial infiltration.  4.  Normal RV size and function.  5.  No significant valvular abnormalities.  6.  Etiology for cardiomyopathy appears non ischemic.     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  Plan on coronary CTA to rule out  ischemic heart disease once she is better rate controlled NYHA class / AHA Stage:NYHA II Volume status & Diuretics: lasix 40mg  daily; takes extra 40mg  PRN at night.  Vasodilators: start Entresto 24/26mg  BID; D/C losartan. Repeat BMP/BNP today; follow up with pharmD in 1 month.  Beta-Blocker:continue coreg 12.5mg  BID ZOX:WRUEAVWU spironolactone 12.5mg  daily Cardiometabolic:jardiance 10mg  daily Devices therapies & Valvulopathies: Not currently indicated; repeat TTE with LVEF 35%-40%.  Advanced therapies: Not indicated  2.  Hypertension -Now well-controlled -  BMP/BNP today - Start Entresto 24/26mg  BID; D/C losartan  3. OSA - not using CPAP currently.  4.  History of alcohol/tobacco use -Was previously drinking up to 1 bottle of tequila a day due to multiple stressors.  She is now completely quit.  Also has stopped smoking.  We continued to discuss importance of this. She is doing very well.  - No longer drinking/smoking.   5. Obesity - Body mass index is 44.78 kg/m. - Discussed weight loss at length; not interested in pharmacologic therapy.   I spent 35 minutes caring for this patient today including face to face time, ordering and reviewing labs, reviewing echocardiogram with her, seeing the patient, documenting in the record, and arranging follow ups.    Sanders Manninen Advanced Heart Failure Mechanical Circulatory Support

## 2023-06-04 LAB — BASIC METABOLIC PANEL
BUN/Creatinine Ratio: 15 (ref 9–23)
BUN: 14 mg/dL (ref 6–24)
CO2: 24 mmol/L (ref 20–29)
Calcium: 9.7 mg/dL (ref 8.7–10.2)
Chloride: 100 mmol/L (ref 96–106)
Creatinine, Ser: 0.93 mg/dL (ref 0.57–1.00)
Glucose: 106 mg/dL — ABNORMAL HIGH (ref 70–99)
Potassium: 4.3 mmol/L (ref 3.5–5.2)
Sodium: 140 mmol/L (ref 134–144)
eGFR: 79 mL/min/{1.73_m2} (ref >59)

## 2023-06-04 LAB — BRAIN NATRIURETIC PEPTIDE: BNP: 11.9 pg/mL (ref 0.0–100.0)

## 2023-06-18 ENCOUNTER — Other Ambulatory Visit: Payer: Self-pay | Admitting: Family

## 2023-07-03 ENCOUNTER — Telehealth: Payer: Self-pay | Admitting: Cardiology

## 2023-07-03 NOTE — Telephone Encounter (Incomplete)
 Called to confirm/remind patient of their appointment at the [facility] on 07/04/23***.   Appointment:   [x] Confirmed  [] Left mess   [] No answer/No voice mail  [] Phone not in service  Patient reminded to bring all medications and/or complete list.  Confirmed patient has transportation. Gave directions, instructed to utilize valet parking.

## 2023-07-04 ENCOUNTER — Ambulatory Visit: Payer: Medicare Other | Attending: Cardiology | Admitting: Cardiology

## 2023-07-04 VITALS — BP 98/64 | HR 80 | Wt 238.0 lb

## 2023-07-04 DIAGNOSIS — Z7984 Long term (current) use of oral hypoglycemic drugs: Secondary | ICD-10-CM | POA: Diagnosis not present

## 2023-07-04 DIAGNOSIS — E669 Obesity, unspecified: Secondary | ICD-10-CM | POA: Diagnosis not present

## 2023-07-04 DIAGNOSIS — I5022 Chronic systolic (congestive) heart failure: Secondary | ICD-10-CM | POA: Diagnosis present

## 2023-07-04 DIAGNOSIS — I429 Cardiomyopathy, unspecified: Secondary | ICD-10-CM | POA: Diagnosis not present

## 2023-07-04 DIAGNOSIS — I110 Hypertensive heart disease with heart failure: Secondary | ICD-10-CM | POA: Diagnosis not present

## 2023-07-04 DIAGNOSIS — E785 Hyperlipidemia, unspecified: Secondary | ICD-10-CM | POA: Insufficient documentation

## 2023-07-04 DIAGNOSIS — Z79899 Other long term (current) drug therapy: Secondary | ICD-10-CM | POA: Diagnosis not present

## 2023-07-04 DIAGNOSIS — Z87891 Personal history of nicotine dependence: Secondary | ICD-10-CM | POA: Diagnosis not present

## 2023-07-04 DIAGNOSIS — Z6841 Body Mass Index (BMI) 40.0 and over, adult: Secondary | ICD-10-CM | POA: Diagnosis not present

## 2023-07-04 DIAGNOSIS — G4733 Obstructive sleep apnea (adult) (pediatric): Secondary | ICD-10-CM | POA: Diagnosis not present

## 2023-07-04 MED ORDER — ROSUVASTATIN CALCIUM 5 MG PO TABS: 5.0000 mg | ORAL_TABLET | Freq: Every day | ORAL | 3 refills | Status: DC

## 2023-07-04 NOTE — Patient Instructions (Signed)
 Medication Changes:  START Rosuvastatin 5mg  (1 tab) ever day   Follow-Up in: Please follow up with the Advanced [facility] Failure Clinic in 3 months with Dr. Gasper Lloyd.  At the [facility], you and your health needs are our priority. We have a designated team specialized in the treatment of Heart Failure. This Care Team includes your primary Heart Failure Specialized Cardiologist (physician), Advanced Practice Providers (APPs- Physician Assistants and Nurse Practitioners), and Pharmacist who all work together to provide you with the care you need, when you need it.   You may see any of the following providers on your designated Care Team at your next follow up:  Dr. Arvilla Meres Dr. Marca Ancona Dr. Dorthula Nettles Dr. Theresia Bough [facility], FNP Enos Fling, RPH-CPP  Please be sure to bring in all your medications bottles to every appointment.   Need to Contact us:  If you have any questions or concerns before your next appointment please send [place] a message through [place] or call our office at [phone].    TO LEAVE A MESSAGE FOR THE NURSE SELECT OPTION 2, PLEASE LEAVE A MESSAGE INCLUDING: YOUR NAME DATE OF BIRTH CALL BACK NUMBER REASON FOR CALL**this is important as we prioritize the call backs  YOU WILL RECEIVE A CALL BACK THE SAME DAY AS LONG AS YOU CALL BEFORE 4:00 PM

## 2023-07-04 NOTE — Progress Notes (Signed)
 ADVANCED HEART FAILURE CLINIC NOTE  Referring Physician: No ref. provider found  Primary Care: Pcp, No HF: Dr. Gasper Lloyd  CC: HFrEF  HPI: Sabrina Mata is a 43 y.o. female with heart failure with reduced ejection fraction presenting today to establish care.  Her cardiac history dates back to May 2024 when she presented to [facility] with complaints of shortness of breath over the preceding few weeks.  During admission she had an echocardiogram that demonstrated LVEF of 30 to 35% with moderate eccentric mitral regurgitation followed by a cardiac MRI that was consistent with nonischemic but no infiltrative cardiomyopathy.  She was started on low-dose GDMT and discharged home.  Otherwise no significant family history of heart disease.  She is a single mother with 2 daughters that are 16/17 year olds.  She currently receives disability due to mental health.  Due to depression around multiple deaths in her family she was drinking very heavily over the past 1 year (up to 1 bottle of tequila nightly with 1 to 1-1/2 pack of cigarettes)  Since that time she has followed with [facility] where GDMT was further uptitrated. Since diagnosis of her heart failure she has become very compliant with medications. She is no longer smoking or drinking alcohol.    Interval hx: - Doing very well. NYHA II functional status. Motivated to improve her health. Compliant with all medications.     Current Outpatient Medications  Medication Sig Dispense Refill   empagliflozin (JARDIANCE) 10 MG TABS tablet Take 1 tablet (10 mg total) by mouth daily. 90 tablet 3   furosemide (LASIX) 40 MG tablet Take 0.5 tablets (20 mg total) by mouth daily. 45 tablet 3   Multiple Vitamin (MULTIVITAMIN WITH MINERALS) TABS tablet Take 1 tablet by mouth daily.     sacubitril-valsartan (ENTRESTO) 24-26 MG Take 1 tablet by mouth 2 (two) times daily. 60 tablet 3   spironolactone (ALDACTONE) 25 MG tablet TAKE 1 TABLET (25 MG TOTAL) BY  MOUTH DAILY. 90 tablet 2   thiamine (VITAMIN B-1) 100 MG tablet Take 1 tablet (100 mg total) by mouth daily. 30 tablet 0   carvedilol (COREG) 12.5 MG tablet Take 1 tablet (12.5 mg total) by mouth 2 (two) times daily. 180 tablet 3   No current facility-administered medications for this visit.     PHYSICAL EXAM: Vitals:   07/04/23 1104  BP: 98/64  Pulse: 80  SpO2: 98%   GENERAL: NAD Lungs- CTA CARDIAC:  JVP: 6 cm          Normal rate with regular rhythm. No murmur.  Pulses 2+. No edema.  ABDOMEN: Soft, non-tender, non-distended.  EXTREMITIES: Warm and well perfused.  NEUROLOGIC: No obvious FND   DATA REVIEW  ECG: 10/30/22: sinus tachycardia  as per my personal interpretation  ECHO: 10/30/22: LVEF 30-35%, Grade II DD,  moderate eccentric MR as per my personal interpretation 05/23/23: LVEF 40%  CMR 10/10/22: Normal LV size, moderately reduced systolic function.  LVEF 32%.  2.  No LV LGE/scar noted.  3.  No evidence for myocardial infiltration.  4.  Normal RV size and function.  5.  No significant valvular abnormalities.  6.  Etiology for cardiomyopathy appears non ischemic.     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  Plan on coronary CTA to rule out ischemic heart disease once she is better rate controlled NYHA class / AHA Stage:NYHA II Volume status & Diuretics: lasix 40mg  daily; takes  extra 40mg  PRN at night.  Vasodilators: continue Entresto 24/26mg  BID; limited by SBP Beta-Blocker:continue coreg 12.5mg  BID; will hold off on uptitration right now.  ZOX:WRUEAVWU spironolactone 12.5mg  daily. Repeat labs at follow up.  Cardiometabolic:jardiance 10mg  daily Devices therapies & Valvulopathies: Not currently indicated; repeat TTE with LVEF 35%-40%.  Advanced therapies: Not indicated  2.  Hypertension -Now well-controlled - Repeat labs at follow up.  - continue Entresto 24/26mg  BID  3. OSA - not  using CPAP currently.  4.  History of alcohol/tobacco use -Was previously drinking up to 1 bottle of tequila a day due to multiple stressors.  She is now completely quit.  Also has stopped smoking.  We continued to discuss importance of this. She is doing very well.  - No longer drinking/smoking.   5. Obesity - Body mass index is 44.97 kg/m. - Discussed weight loss at length; not interested in pharmacologic therapy.   6. Hyperlipidemia - LDL on 04/23/23 of 156 - start crestor 5mg  daily  Donnita Farina Advanced Heart Failure Mechanical Circulatory Support

## 2023-09-26 ENCOUNTER — Other Ambulatory Visit: Payer: Self-pay | Admitting: Cardiology

## 2023-10-09 ENCOUNTER — Ambulatory Visit: Attending: Cardiology | Admitting: Cardiology

## 2023-10-09 ENCOUNTER — Encounter: Payer: Self-pay | Admitting: Cardiology

## 2023-10-09 VITALS — BP 113/92 | HR 81 | Wt 237.2 lb

## 2023-10-09 DIAGNOSIS — Z79899 Other long term (current) drug therapy: Secondary | ICD-10-CM | POA: Insufficient documentation

## 2023-10-09 DIAGNOSIS — I10 Essential (primary) hypertension: Secondary | ICD-10-CM

## 2023-10-09 DIAGNOSIS — G4733 Obstructive sleep apnea (adult) (pediatric): Secondary | ICD-10-CM | POA: Diagnosis not present

## 2023-10-09 DIAGNOSIS — I110 Hypertensive heart disease with heart failure: Secondary | ICD-10-CM | POA: Diagnosis not present

## 2023-10-09 DIAGNOSIS — E785 Hyperlipidemia, unspecified: Secondary | ICD-10-CM | POA: Insufficient documentation

## 2023-10-09 DIAGNOSIS — E669 Obesity, unspecified: Secondary | ICD-10-CM | POA: Insufficient documentation

## 2023-10-09 DIAGNOSIS — Z6841 Body Mass Index (BMI) 40.0 and over, adult: Secondary | ICD-10-CM | POA: Insufficient documentation

## 2023-10-09 DIAGNOSIS — F1091 Alcohol use, unspecified, in remission: Secondary | ICD-10-CM | POA: Insufficient documentation

## 2023-10-09 DIAGNOSIS — I5022 Chronic systolic (congestive) heart failure: Secondary | ICD-10-CM | POA: Insufficient documentation

## 2023-10-09 DIAGNOSIS — Z87891 Personal history of nicotine dependence: Secondary | ICD-10-CM | POA: Insufficient documentation

## 2023-10-09 MED ORDER — SACUBITRIL-VALSARTAN 24-26 MG PO TABS: 1.0000 | ORAL_TABLET | Freq: Two times a day (BID) | ORAL | Status: DC

## 2023-10-09 MED ORDER — EMPAGLIFLOZIN 10 MG PO TABS: 10.0000 mg | ORAL_TABLET | Freq: Every day | ORAL | Status: DC

## 2023-10-09 NOTE — Patient Instructions (Signed)
 Medication Changes:  No medication changes today!  Lab Work:  Go over to the [facility]ICAL [facility]ALL. Go pass the gift shop and have your blood work completed IN ONE WEEK.  We will only call you if the results are abnormal or if the provider would like to make medication changes.   Testing/Procedures:  Your physician has requested that you have an echocardiogram. Echocardiography is a painless test that uses sound waves to create images of your heart. It provides your doctor with information about the size and shape of your heart and how well your heart's chambers and valves are working. This procedure takes approximately one hour. There are no restrictions for this procedure. Please do NOT wear cologne, perfume, aftershave, or lotions (deodorant is allowed). Please arrive 15 minutes prior to your appointment time.  Please note: We ask at that you not bring children with you during ultrasound (echo/ vascular) testing. Due to room size and safety concerns, children are not allowed in the ultrasound rooms during exams. Our front office staff cannot provide observation of children in our lobby area while testing is being conducted. An adult accompanying a patient to their appointment will only be allowed in the ultrasound room at the discretion of the ultrasound technician under special circumstances. We apologize for any inconvenience.    Follow-Up in: Please follow up with the Advanced [facility] Failure Clinic in 3 months with Dr. Gardenia and an echocardiogram.  At the [facility], you and your health needs are our priority. We have a designated team specialized in the treatment of Heart Failure. This Care Team includes your primary Heart Failure Specialized Cardiologist (physician), Advanced Practice Providers (APPs- Physician Assistants and Nurse Practitioners), and Pharmacist who all work together to provide you with the care you need, when you need it.   You may see any of the  following providers on your designated Care Team at your next follow up:  Dr. Toribio Fuel Dr. Ezra Shuck Dr. Ria Gardenia Dr. Odis Brownie [name], FNP Jaun Bash, RPH-CPP  Please be sure to bring in all your medications bottles to every appointment.   Need to Contact Us :  If you have any questions or concerns before your next appointment please send us  a message through [place] or call our office at [phone].    TO LEAVE A MESSAGE FOR THE NURSE SELECT OPTION 2, PLEASE LEAVE A MESSAGE INCLUDING: YOUR NAME DATE OF BIRTH CALL BACK NUMBER REASON FOR CALL**this is important as we prioritize the call backs  YOU WILL RECEIVE A CALL BACK THE SAME DAY AS LONG AS YOU CALL BEFORE 4:00 PM

## 2023-10-09 NOTE — Progress Notes (Signed)
 ADVANCED HEART FAILURE CLINIC NOTE  Referring Physician: No ref. provider found  Primary Care: Pcp, No HF: Dr. Gardenia  CC: HFrEF  HPI: Sabrina Mata is a 43 y.o. female with heart failure with reduced ejection fraction presenting today to establish care.  Her cardiac history dates back to May 2024 when she presented to [facility] with complaints of shortness of breath over the preceding few weeks.  During admission she had an echocardiogram that demonstrated LVEF of 30 to 35% with moderate eccentric mitral regurgitation followed by a cardiac MRI that was consistent with nonischemic but no infiltrative cardiomyopathy.  She was started on low-dose GDMT and discharged home.  Otherwise no significant family history of heart disease.  She is a single mother with 2 daughters that are 16/17 year olds.  She currently receives disability due to mental health.  Due to depression around multiple deaths in her family she was drinking very heavily over the past 1 year (up to 1 bottle of tequila nightly with 1 to 1-1/2 pack of cigarettes)  Since that time she has followed with [name] where GDMT was further uptitrated. Since diagnosis of her heart failure she has become very compliant with medications. She is no longer smoking or drinking alcohol.    Interval hx: - Overall she has been doing very well; no shortness of breath or dizziness.  - She has moved into a new apartment with a gym; motivated to exercise.  - Compliant with all medications.    Current Outpatient Medications  Medication Sig Dispense Refill   carvedilol  (COREG ) 12.5 MG tablet Take 1 tablet (12.5 mg total) by mouth 2 (two) times daily. 180 tablet 3   empagliflozin  (JARDIANCE ) 10 MG TABS tablet Take 1 tablet (10 mg total) by mouth daily. 90 tablet 3   ENTRESTO  24-26 MG TAKE 1 TABLET BY MOUTH TWICE A DAY 60 tablet 3   furosemide  (LASIX ) 40 MG tablet Take 0.5 tablets (20 mg total) by mouth daily. 45 tablet 3   Multiple  Vitamin (MULTIVITAMIN WITH MINERALS) TABS tablet Take 1 tablet by mouth daily.     rosuvastatin  (CRESTOR ) 5 MG tablet Take 1 tablet (5 mg total) by mouth daily. 90 tablet 3   spironolactone  (ALDACTONE ) 25 MG tablet TAKE 1 TABLET (25 MG TOTAL) BY MOUTH DAILY. 90 tablet 2   thiamine  (VITAMIN B-1) 100 MG tablet Take 1 tablet (100 mg total) by mouth daily. 30 tablet 0   No current facility-administered medications for this visit.     PHYSICAL EXAM: Vitals:   10/09/23 1451  BP: (!) 113/92  Pulse: 81  SpO2: 100%   GENERAL: NAD Lungs- CTA CARDIAC:  JVP: 6 cm          Normal rate with regular rhythm. no murmur.  Pulses 2+. no edema.  ABDOMEN: Soft, non-tender, non-distended.  EXTREMITIES: Warm and well perfused.  NEUROLOGIC: No obvious FND   DATA REVIEW  ECG: 10/30/22: sinus tachycardia  as per my personal interpretation  ECHO: 10/30/22: LVEF 30-35%, Grade II DD,  moderate eccentric MR as per my personal interpretation 05/23/23: LVEF 40%  CMR 10/10/22: Normal LV size, moderately reduced systolic function.  LVEF 32%.  2.  No LV LGE/scar noted.  3.  No evidence for myocardial infiltration.  4.  Normal RV size and function.  5.  No significant valvular abnormalities.  6.  Etiology for cardiomyopathy appears non ischemic.     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  Plan on coronary CTA to rule out ischemic heart disease once she is better rate controlled NYHA class / AHA Stage:NYHA II Volume status & Diuretics: lasix  40mg  daily; euvolemic on exam.  Vasodilators: continue Entresto  24/26mg  BID; repeat TTE at follow up. Will increase at that time.  Beta-Blocker:continue coreg  12.5mg  BID; uptitrate at follow up.  FMJ:rnwupwlz spironolactone  12.5mg  daily. Repeat labs today.  Cardiometabolic:jardiance  10mg  daily; give samples today. Devices therapies & Valvulopathies: Not currently indicated; repeat TTE with LVEF  35%-40%.  Advanced therapies: Not indicated  2.  Hypertension -well controlled.  -continue entresto  24/26mg  BID  3. OSA - not using CPAP currently.  4.  History of alcohol/tobacco use -Was previously drinking up to 1 bottle of tequila a day due to multiple stressors.  She is now completely quit.  Also has stopped smoking.  We continued to discuss importance of this. She is doing very well.  - we discussed importance of continued cessation.   5. Obesity - Body mass index is 44.82 kg/m. - Discussed weight loss at length; not interested in pharmacologic therapy.   6. Hyperlipidemia - LDL on 04/23/23 of 156 - continue crestor  5mg  daily.   I spent 31 minutes caring for this patient today including face to face time, ordering and reviewing labs, discussing continued tobacco cessation, seeing the patient, documenting in the record, and arranging follow ups.   Anjolina Byrer Advanced Heart Failure Mechanical Circulatory Support

## 2023-11-04 ENCOUNTER — Other Ambulatory Visit: Payer: Self-pay | Admitting: Cardiology

## 2023-12-20 ENCOUNTER — Encounter: Payer: Self-pay | Admitting: Cardiology

## 2024-01-08 ENCOUNTER — Telehealth: Payer: Self-pay | Admitting: Cardiology

## 2024-01-08 NOTE — Telephone Encounter (Signed)
 Called to confirm/remind patient of their appointment at the [facility] on 01/09/24.   Appointment:   [x] Confirmed  [] Left mess   [] No answer/No voice mail  [] VM Full/unable to leave message  [] Phone not in service  Patient reminded to bring all medications and/or complete list.  Confirmed patient has transportation. Gave directions, instructed to utilize valet parking.

## 2024-01-09 ENCOUNTER — Ambulatory Visit: Admitting: Cardiology

## 2024-01-09 ENCOUNTER — Ambulatory Visit
Admission: RE | Admit: 2024-01-09 | Discharge: 2024-01-09 | Disposition: A | Discharge status: Home / Self Care | Source: Ambulatory Visit | Attending: Cardiology | Admitting: Cardiology

## 2024-01-09 VITALS — BP 147/89 | HR 77 | Wt 240.0 lb

## 2024-01-09 DIAGNOSIS — I371 Nonrheumatic pulmonary valve insufficiency: Secondary | ICD-10-CM | POA: Diagnosis not present

## 2024-01-09 DIAGNOSIS — I5022 Chronic systolic (congestive) heart failure: Secondary | ICD-10-CM | POA: Diagnosis not present

## 2024-01-09 DIAGNOSIS — I340 Nonrheumatic mitral (valve) insufficiency: Secondary | ICD-10-CM | POA: Diagnosis not present

## 2024-01-09 DIAGNOSIS — G4733 Obstructive sleep apnea (adult) (pediatric): Secondary | ICD-10-CM | POA: Diagnosis not present

## 2024-01-09 LAB — ECHOCARDIOGRAM COMPLETE
AR max vel: 3.1 cm2
AV Area VTI: 3.2 cm2
AV Area mean vel: 3.02 cm2
AV Mean grad: 3 mmHg
AV Peak grad: 6.2 mmHg
Ao pk vel: 1.24 m/s
Area-P 1/2: 3.17 cm2
Calc EF: 43.3 %
MV VTI: 3.36 cm2
S' Lateral: 3.4 cm
Single Plane A2C EF: 40.6 %
Single Plane A4C EF: 43.8 %

## 2024-01-09 MED ORDER — SACUBITRIL-VALSARTAN 49-51 MG PO TABS: 1.0000 | ORAL_TABLET | Freq: Two times a day (BID) | ORAL | 6 refills | Status: AC

## 2024-01-09 NOTE — Progress Notes (Signed)
 ADVANCED HEART FAILURE CLINIC NOTE  Referring Physician: Gardenia Led, DO  Primary Care: Pcp, No HF: Dr. Gardenia  CC: HFrEF  HPI: Sabrina Mata is a 43 y.o. female with heart failure with reduced ejection fraction presenting today to establish care.  Her cardiac history dates back to May 2024 when she presented to [facility] with complaints of shortness of breath over the preceding few weeks.  During admission she had an echocardiogram that demonstrated LVEF of 30 to 35% with moderate eccentric mitral regurgitation followed by a cardiac MRI that was consistent with nonischemic but no infiltrative cardiomyopathy.  She was started on low-dose GDMT and discharged home.  Otherwise no significant family history of heart disease.  She is a single mother with 2 daughters that are 16/17 year olds.  She currently receives disability due to mental health.  Due to depression around multiple deaths in her family she was drinking very heavily over the past 1 year (up to 1 bottle of tequila nightly with 1 to 1-1/2 pack of cigarettes)  Since that time she has followed with [name] where GDMT was further uptitrated. Since diagnosis of her heart failure she has become very compliant with medications. She is no longer smoking or drinking alcohol.    Interval hx: - Overall continues to do well.  - Echocardiogram today reviewed and shows mild continued improvement in ejection fraction, now 40-45% - Has been taking her medications without issue. - No significant change in her symptoms   Current Outpatient Medications  Medication Sig Dispense Refill   carvedilol  (COREG ) 12.5 MG tablet TAKE 1 TABLET BY MOUTH 2 TIMES DAILY. 180 tablet 3   empagliflozin  (JARDIANCE ) 10 MG TABS tablet Take 1 tablet (10 mg total) by mouth daily.     furosemide  (LASIX ) 40 MG tablet Take 0.5 tablets (20 mg total) by mouth daily. 45 tablet 3   Multiple Vitamin (MULTIVITAMIN WITH MINERALS) TABS tablet Take 1 tablet by  mouth daily.     rosuvastatin  (CRESTOR ) 5 MG tablet Take 1 tablet (5 mg total) by mouth daily. 90 tablet 3   sacubitril -valsartan  (ENTRESTO ) 24-26 MG Take 1 tablet by mouth 2 (two) times daily.     spironolactone  (ALDACTONE ) 25 MG tablet TAKE 1 TABLET (25 MG TOTAL) BY MOUTH DAILY. 90 tablet 2   thiamine  (VITAMIN B-1) 100 MG tablet Take 1 tablet (100 mg total) by mouth daily. 30 tablet 0   No current facility-administered medications for this visit.     PHYSICAL EXAM: Vitals:   01/09/24 1024  BP: (!) 147/89  Pulse: 77  SpO2: 100%    GENERAL: NAD, well appearing PULM:  Normal work of breathing, CTAB CARDIAC:  JVP: flat         Normal rate with regular rhythm. No murmurs, rubs or gallops.  No edema. Warm and well perfused extremities. ABDOMEN: Soft, non-tender, non-distended. NEUROLOGIC: Patient is oriented x3 with no focal or lateralizing neurologic deficits.     DATA REVIEW  ECG: 10/30/22: sinus tachycardia  as per my personal interpretation  ECHO: 10/30/22: LVEF 30-35%, Grade II DD,  moderate eccentric MR as per my personal interpretation 05/23/23: LVEF 40% 01/09/2024: LVEF 40-45%  CMR 10/10/22: Normal LV size, moderately reduced systolic function.  LVEF 32%.  2.  No LV LGE/scar noted.  3.  No evidence for myocardial infiltration.  4.  Normal RV size and function.  5.  No significant valvular abnormalities.  6.  Etiology for cardiomyopathy appears non ischemic.  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  Plan on coronary CTA to rule out ischemic heart disease once she is better rate controlled. Potentially at next visit NYHA class / AHA Stage:NYHA II Volume status & Diuretics: lasix  40mg  daily, instructed she may be able to go to prn after increase in entresto  Vasodilators: Increase entresto  to 49/51mg  BID given elevated BP Beta-Blocker:Continue coreg  12.5mg  BID.  FMJ:Rnwupwlz spironolactone   12.5mg  daily.  Cardiometabolic: continue jardiance  10mg  daily. Devices therapies & Valvulopathies: Not currently indicated; repeat TTE with LVEF 40-45% Advanced therapies: Not indicated  2.  Hypertension -mildly elevated, increase entresto   3. OSA - not using CPAP currently, encouraged use  4.  History of alcohol/tobacco use -Was previously drinking up to 1 bottle of tequila a day due to multiple stressors.  She is now completely quit.  Also has stopped smoking.    5. Obesity - Body mass index is 45.35 kg/m. - Not interested in GLP1 at this time  6. Hyperlipidemia - LDL on 04/23/23 of 156 - continue crestor  5mg  daily.    [name] Advanced Heart Failure Mechanical Circulatory Support

## 2024-01-09 NOTE — Patient Instructions (Signed)
 Medication Changes:  INCREASE Entresto  49/51 mg (1 tab) daily   Follow-Up in: Please follow up with the [facility] in 3 months with Dr. Gardenia.    Thank you for choosing  [facility].    At the [facility], you and your health needs are our priority. We have a designated team specialized in the treatment of Heart Failure. This Care Team includes your primary Heart Failure Specialized Cardiologist (physician), Advanced Practice Providers (APPs- Physician Assistants and Nurse Practitioners), and Pharmacist who all work together to provide you with the care you need, when you need it.   You may see any of the following providers on your designated Care Team at your next follow up:  Dr. Toribio Fuel Dr. Ezra Shuck Dr. Ria Gardenia Dr. [name] [name], FNP Jaun Bash, RPH-CPP  Please be sure to bring in all your medications bottles to every appointment.   Need to Contact Us :  If you have any questions or concerns before your next appointment please send us  a message through [facility] or call our office at [phone].    TO LEAVE A MESSAGE FOR THE NURSE SELECT OPTION 2, PLEASE LEAVE A MESSAGE INCLUDING: YOUR NAME DATE OF BIRTH CALL BACK NUMBER REASON FOR CALL**this is important as we prioritize the call backs  YOU WILL RECEIVE A CALL BACK THE SAME DAY AS LONG AS YOU CALL BEFORE 4:00 PM

## 2024-02-05 ENCOUNTER — Other Ambulatory Visit: Payer: Self-pay | Admitting: Family

## 2024-03-12 ENCOUNTER — Telehealth: Payer: Self-pay | Admitting: Family

## 2024-03-12 NOTE — Telephone Encounter (Signed)
 Called to confirm/remind patient of their appointment at the [facility] on 05/04/23.   Appointment:   [x] Confirmed  [] Left mess   [] No answer/No voice mail  [] VM Full/unable to leave message  [] Phone not in service  Patient reminded to bring all medications and/or complete list.  Confirmed patient has transportation. Gave directions, instructed to utilize valet parking.

## 2024-03-12 NOTE — Progress Notes (Unsigned)
 ADVANCED HE[facility] FAILURE CLINIC NOTE  Referring Physician: No ref. provider Mata  Primary Care: Pcp, Sabrina HF: Sabrina Mata  CC: [id number]  HPI: Sabrina Mata is a 43 y.o. female with heart failure with reduced ejection fraction presenting today to establish care.  Her cardiac history dates back to May 2024 when she presented to [facility] with complaints of shortness of breath over the preceding few weeks.  During admission she had an echocardiogram that demonstrated LVEF of 30 to 35% with moderate eccentric mitral regurgitation followed by a cardiac MRI that was consistent with nonischemic but Sabrina infiltrative cardiomyopathy.  She was started on low-dose GDMT and discharged home.  Otherwise Sabrina significant family history of heart disease.  She is a single mother with 2 daughters that are 16/17 year olds.  She currently receives disability due to mental health.  Due to depression around multiple deaths in her family she was drinking very heavily over the past 1 year (up to 1 bottle of tequila nightly with 1 to 1-1/2 pack of cigarettes)  Since that time she has followed with Sabrina Mata where GDMT was further uptitrated. Since diagnosis of her heart failure she has become very compliant with medications. She is Sabrina longer smoking or drinking alcohol.    Interval hx: - Overall continues to do well.  - Echocardiogram today reviewed and shows mild continued improvement in ejection fraction, now 40-45% - Has been taking her medications without issue. - Sabrina significant change in her symptoms   Current Outpatient Medications  Medication Sig Dispense Refill   carvedilol  (COREG ) 12.5 MG tablet TAKE 1 TABLET BY MOUTH 2 TIMES DAILY. 180 tablet 3   empagliflozin  (JARDIANCE ) 10 MG TABS tablet Take 1 tablet (10 mg total) by mouth daily.     furosemide  (LASIX ) 40 MG tablet Take 1 tablet (40 mg total) by mouth daily. 90 tablet 2   Multiple Vitamin (MULTIVITAMIN WITH MINERALS) TABS tablet Take 1 tablet by  mouth daily.     rosuvastatin  (CRESTOR ) 5 MG tablet Take 1 tablet (5 mg total) by mouth daily. 90 tablet 3   sacubitril -valsartan  (ENTRESTO ) 49-51 MG Take 1 tablet by mouth 2 (two) times daily. 60 tablet 6   spironolactone  (ALDACTONE ) 25 MG tablet TAKE 1 TABLET (25 MG TOTAL) BY MOUTH DAILY. 90 tablet 2   thiamine  (VITAMIN B-1) 100 MG tablet Take 1 tablet (100 mg total) by mouth daily. 30 tablet 0   Sabrina current facility-administered medications for this visit.     PHYSICAL EXAM: There were Sabrina vitals filed for this visit.   GENERAL: NAD, well appearing PULM:  Normal work of breathing, CTAB CARDIAC:  JVP: flat         Normal rate with regular rhythm. Sabrina murmurs, rubs or gallops.  Sabrina edema. Warm and well perfused extremities. ABDOMEN: Soft, non-tender, non-distended. NEUROLOGIC: Patient is oriented x3 with Sabrina focal or lateralizing neurologic deficits.     DATA REVIEW  ECG: 10/30/22: sinus tachycardia  as per my personal interpretation  ECHO: 10/30/22: LVEF 30-35%, Grade II DD,  moderate eccentric MR as per my personal interpretation 05/23/23: LVEF 40% 01/09/2024: LVEF 40-45%  CMR 10/10/22: Normal LV size, moderately reduced systolic function.  LVEF 32%.  2.  Sabrina LV LGE/scar noted.  3.  Sabrina evidence for myocardial infiltration.  4.  Normal RV size and function.  5.  Sabrina significant valvular abnormalities.  6.  Etiology for cardiomyopathy appears non ischemic.     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  Plan on coronary CTA to rule out ischemic heart disease once she is better rate controlled. Potentially at next visit NYHA Mata / AHA Stage:NYHA II Volume status & Diuretics: lasix  40mg  daily, instructed she may be able to go to prn after increase in entresto  Vasodilators: Increase entresto  to 49/51mg  BID given elevated BP Beta-Blocker:Continue coreg  12.5mg  BID.  FMJ:Rnwupwlz spironolactone  12.5mg  daily.   Cardiometabolic: continue jardiance  10mg  daily. Devices therapies & Valvulopathies: Not currently indicated; repeat TTE with LVEF 40-45% Advanced therapies: Not indicated  2.  Hypertension -mildly elevated, increase entresto   3. OSA - not using CPAP currently, encouraged use  4.  History of alcohol/tobacco use -Was previously drinking up to 1 bottle of tequila a day due to multiple stressors.  She is now completely quit.  Also has stopped smoking.    5. Obesity - There is Sabrina height or weight on file to calculate BMI. - Not interested in GLP1 at this time  6. Hyperlipidemia - LDL on 04/23/23 of 156 - continue crestor  5mg  daily.    Sabrina Mata Advanced Heart Failure Mechanical Circulatory Support

## 2024-03-13 ENCOUNTER — Encounter (HOSPITAL_COMMUNITY): Payer: Self-pay

## 2024-03-13 ENCOUNTER — Ambulatory Visit: Attending: Family | Admitting: Family

## 2024-03-13 ENCOUNTER — Encounter: Admitting: Cardiology

## 2024-03-13 ENCOUNTER — Encounter: Payer: Self-pay | Admitting: Family

## 2024-03-13 VITALS — BP 106/67 | HR 84 | Wt 246.8 lb

## 2024-03-13 DIAGNOSIS — E782 Mixed hyperlipidemia: Secondary | ICD-10-CM

## 2024-03-13 DIAGNOSIS — G4733 Obstructive sleep apnea (adult) (pediatric): Secondary | ICD-10-CM

## 2024-03-13 DIAGNOSIS — I10 Essential (primary) hypertension: Secondary | ICD-10-CM

## 2024-03-13 DIAGNOSIS — F1090 Alcohol use, unspecified, uncomplicated: Secondary | ICD-10-CM

## 2024-03-13 DIAGNOSIS — I5042 Chronic combined systolic (congestive) and diastolic (congestive) heart failure: Secondary | ICD-10-CM

## 2024-03-13 LAB — BASIC METABOLIC PANEL WITH GFR
BUN/Creatinine Ratio: 15 (ref 9–23)
BUN: 15 mg/dL (ref 6–24)
CO2: 24 mmol/L (ref 20–29)
Calcium: 9.5 mg/dL (ref 8.7–10.2)
Chloride: 99 mmol/L (ref 96–106)
Creatinine, Ser: 0.99 mg/dL (ref 0.57–1.00)
Glucose: 98 mg/dL (ref 70–99)
Potassium: 4.2 mmol/L (ref 3.5–5.2)
Sodium: 140 mmol/L (ref 134–144)
eGFR: 73 mL/min/1.73 (ref >59)

## 2024-03-13 LAB — LIPID PANEL
Chol/HDL Ratio: 4.9 ratio — ABNORMAL HIGH (ref 0.0–4.4)
Cholesterol, Total: 183 mg/dL (ref 100–199)
HDL: 37 mg/dL — ABNORMAL LOW (ref >39)
LDL Chol Calc (NIH): 128 mg/dL — ABNORMAL HIGH (ref 0–99)
Triglycerides: 97 mg/dL (ref 0–149)
VLDL Cholesterol Cal: 18 mg/dL (ref 5–40)

## 2024-03-13 MED ORDER — SPIRONOLACTONE 25 MG PO TABS: 25.0000 mg | ORAL_TABLET | Freq: Every day | ORAL | 1 refills | Status: AC

## 2024-03-13 MED ORDER — ROSUVASTATIN CALCIUM 5 MG PO TABS: 5.0000 mg | ORAL_TABLET | Freq: Every day | ORAL | 1 refills | Status: DC

## 2024-03-13 MED ORDER — EMPAGLIFLOZIN 10 MG PO TABS: 10.0000 mg | ORAL_TABLET | Freq: Every day | ORAL | 1 refills | Status: AC

## 2024-03-13 NOTE — Patient Instructions (Addendum)
 Medication Changes:  No medication changes today!  Lab Work:   Go downstairs to [facility] on [address] to have your blood work completed.  We will only call you if the results are abnormal or if the provider would like to make medication changes.  No news is good news.     Testing/Procedures:    Your cardiac CT will be scheduled at one of the below locations:    [facility] [address] [place], [place] [address] [phone]   If scheduled at [facility], please arrive to the Heart and Vascular Center 15 mins early for check-in and test prep.  There is spacious parking and easy access to the radiology department from the [facility] Heart and Vascular entrance. Please enter here and check-in with the desk attendant.    Please follow these instructions carefully (unless otherwise directed):  An IV will be required for this test and Nitroglycerin  will be given.  Hold all erectile dysfunction medications at least 3 days (72 hrs) prior to test. (Ie viagra, cialis, sildenafil, tadalafil, etc)   On the Night Before the Test: Be sure to Drink plenty of water. Do not consume any caffeinated/decaffeinated beverages or chocolate 12 hours prior to your test. Do not take any antihistamines 12 hours prior to your test.    On the Day of the Test: Drink plenty of water until 1 hour prior to the test. Do not eat any food 1 hour prior to test. You may take your regular medications prior to the test.  Take metoprolol  (Lopressor ) two hours prior to test. If you take Furosemide /Hydrochlorothiazide/Spironolactone /Chlorthalidone, please HOLD on the morning of the test. Patients who wear a continuous glucose monitor MUST remove the device prior to scanning. FEMALES- please wear underwire-free bra if available, avoid dresses & tight clothing       After the Test: Drink plenty of water. After receiving IV contrast, you may experience a mild  flushed feeling. This is normal. On occasion, you may experience a mild rash up to 24 hours after the test. This is not dangerous. If this occurs, you can take Benadryl 25 mg, Zyrtec, Claritin , or Allegra and increase your fluid intake. (Patients taking Tikosyn should avoid Benadryl, and may take Zyrtec, Claritin , or Allegra) If you experience trouble breathing, this can be serious. If it is severe call [phone] IMMEDIATELY. If it is mild, please call our office.  We will call to schedule your test 2-4 weeks out understanding that some insurance companies will need an authorization prior to the service being performed.   For more information and frequently asked questions, please visit our website : [email or web address]  For non-scheduling related questions, please contact the cardiac imaging nurse navigator should you have any questions/concerns: Cardiac Imaging Nurse Navigators Direct Office Dial: [phone]   For scheduling needs, including cancellations and rescheduling, please call Mubarak Bevens, [phone].     Follow-Up in: Please follow up with the [facility] in 2 months with [name], FNP.   Thank you for choosing [facility].    At the [facility], you and your health needs are our priority. We have a designated team specialized in the treatment of Heart Failure. This Care Team includes your primary Heart Failure Specialized Cardiologist (physician), Advanced Practice Providers (APPs- Physician Assistants and Nurse Practitioners), and Pharmacist who all work together to provide you with the care you need, when you need it.   You  may see any of the following providers on your designated Care Team at your next follow up:  Dr. Toribio Fuel Dr. Ezra Shuck Dr. Ria Commander Dr. [name] [name], FNP Jaun Bash, RPH-CPP  Please be sure to bring in all your medications  bottles to every appointment.   Need to Contact Us :  If you have any questions or concerns before your next appointment please send us  a message through [facility] or call our office at [phone].    TO LEAVE A MESSAGE FOR THE NURSE SELECT OPTION 2, PLEASE LEAVE A MESSAGE INCLUDING: YOUR NAME DATE OF BIRTH CALL BACK NUMBER REASON FOR CALL**this is important as we prioritize the call backs  YOU WILL RECEIVE A CALL BACK THE SAME DAY AS LONG AS YOU CALL BEFORE 4:00 PM

## 2024-03-15 ENCOUNTER — Other Ambulatory Visit: Payer: Self-pay | Admitting: Family

## 2024-03-15 ENCOUNTER — Ambulatory Visit: Payer: Self-pay | Admitting: Family

## 2024-03-15 MED ORDER — ROSUVASTATIN CALCIUM 10 MG PO TABS: 10.0000 mg | ORAL_TABLET | Freq: Every day | ORAL | 1 refills | Status: AC

## 2024-03-16 ENCOUNTER — Ambulatory Visit

## 2024-04-20 ENCOUNTER — Encounter: Payer: Self-pay | Admitting: Family

## 2024-05-07 ENCOUNTER — Encounter (HOSPITAL_COMMUNITY): Payer: Self-pay

## 2024-05-15 ENCOUNTER — Telehealth: Payer: Self-pay | Admitting: Family

## 2024-05-15 NOTE — Telephone Encounter (Signed)
 Called to confirm/remind patient of their appointment at the [facility] on 05/18/24.   Appointment:   [x] Confirmed  [] Left mess   [] No answer/No voice mail  [] VM Full/unable to leave message  [] Phone not in service  Patient reminded to bring all medications and/or complete list.  Confirmed patient has transportation. Gave directions, instructed to utilize valet parking.

## 2024-05-18 ENCOUNTER — Ambulatory Visit: Admitting: Family
